# Patient Record
Sex: Male | Born: 1975 | Hispanic: No | Marital: Single | Smoking: Current some day smoker
Health system: Southern US, Community
[De-identification: ages and names within clinical notes are randomized; demographics above are authoritative.]

## PROBLEM LIST (undated history)

## (undated) DIAGNOSIS — F1910 Other psychoactive substance abuse, uncomplicated: Secondary | ICD-10-CM

## (undated) DIAGNOSIS — F419 Anxiety disorder, unspecified: Secondary | ICD-10-CM

## (undated) DIAGNOSIS — B019 Varicella without complication: Secondary | ICD-10-CM

## (undated) HISTORY — PX: WRIST SURGERY: SHX841

## (undated) HISTORY — DX: Varicella without complication: B01.9

## (undated) HISTORY — DX: Other psychoactive substance abuse, uncomplicated: F19.10

---

## 2002-06-30 ENCOUNTER — Emergency Department (HOSPITAL_COMMUNITY): Admission: EM | Admit: 2002-06-30 | Discharge: 2002-07-01 | Payer: Self-pay | Admitting: *Deleted

## 2002-06-30 ENCOUNTER — Encounter: Payer: Self-pay | Admitting: Emergency Medicine

## 2009-11-02 ENCOUNTER — Ambulatory Visit (HOSPITAL_BASED_OUTPATIENT_CLINIC_OR_DEPARTMENT_OTHER): Admission: RE | Admit: 2009-11-02 | Discharge: 2009-11-02 | Payer: Self-pay | Admitting: Orthopedic Surgery

## 2010-09-19 LAB — POCT HEMOGLOBIN-HEMACUE: Hemoglobin: 15.7 g/dL (ref 13.0–17.0)

## 2013-02-23 ENCOUNTER — Ambulatory Visit (INDEPENDENT_AMBULATORY_CARE_PROVIDER_SITE_OTHER): Payer: BC Managed Care – PPO | Admitting: Emergency Medicine

## 2013-02-23 VITALS — BP 142/96 | HR 106 | Temp 98.2°F | Resp 18 | Wt 153.0 lb

## 2013-02-23 DIAGNOSIS — B084 Enteroviral vesicular stomatitis with exanthem: Secondary | ICD-10-CM

## 2013-02-23 MED ORDER — TRAMADOL HCL 50 MG PO TABS: 50.0000 mg | ORAL_TABLET | Freq: Three times a day (TID) | ORAL | Status: DC | PRN

## 2013-02-23 NOTE — Progress Notes (Signed)
[facility] [address] [place] [address] [id number] [phone]  Date:  02/23/2013   Name:  Ronald Benton [facility]   DOB:  1975/12/04   MRN:  [id number]  PCP:  No primary provider on file.    Chief Complaint: Rash   History of Present Illness:  TRAQUAN DUARTE is a 37 y.o. very pleasant male patient who presents with the following:  Ill since Thursday with rash on palms and soles and inside mouth.  Seen by [facility] Friday and was given a cortisone shot.  Has no fever or chills, nausea or vomiting, cough or coryza.  No sick contacts.  No improvement with over the counter medications or other home remedies. Denies other complaint or health concern today.   There are no active problems to display for this patient.   Past Medical History  Diagnosis Date  . Substance abuse     History reviewed. No pertinent past surgical history.  History  Substance Use Topics  . Smoking status: Current Every Day Smoker    Types: Cigarettes  . Smokeless tobacco: Not on file  . Alcohol Use: Yes    History reviewed. No pertinent family history.  No Known Allergies  Medication list has been reviewed and updated.  No current outpatient prescriptions on file prior to visit.   No current facility-administered medications on file prior to visit.    Review of Systems:  As per HPI, otherwise negative.    Physical Examination: Filed Vitals:   02/23/13 1616  BP: 142/96  Pulse: 106  Temp: 98.2 F (36.8 C)  Resp: 18   Filed Vitals:   02/23/13 1616  Weight: 153 lb (69.4 kg)   There is no height on file to calculate BMI. Ideal Body Weight:    GEN: WDWN, NAD, Non-toxic, A & O x 3  Smells of alcohol HEENT: Atraumatic, Normocephalic. Neck supple. No masses, No LAD.  Oropharynx erythematous Ears and Nose: No external deformity. CV: RRR, No M/G/R. No JVD. No thrill. No extra heart sounds. PULM: CTA B, no wheezes, crackles, rhonchi. No retractions. No resp. distress. No accessory  muscle use. ABD: S, NT, ND, +BS. No rebound. No HSM. EXTR: No c/c/e NEURO Normal gait.  PSYCH: Normally interactive. Conversant. Not depressed or anxious appearing.  Calm demeanor.  SKIN:  Rash on palms and soles of feet characteristic of hand foot and mouth disease  Assessment and Plan: Hand foot and mouth disease Tramadol   Signed,  Phillips Odor, MD

## 2013-02-23 NOTE — Patient Instructions (Signed)
Hand, Foot, and Mouth Disease  Hand, foot, and mouth disease is a common viral illness. It occurs mainly in children younger than 37 years of age, but adolescents and adults may also get it. This disease is different than foot and mouth disease that cattle, sheep, and pigs get. Most people are better in 1 week.  CAUSES   Hand, foot, and mouth disease is usually caused by a group of viruses called enteroviruses. Hand, foot, and mouth disease can spread from person to person (contagious). A person is most contagious during the first week of the illness. It is not transmitted to or from pets or other animals. It is most common in the summer and early fall. Infection is spread from person to person by direct contact with an infected person's:  · Nose discharge.  · Throat discharge.  · Stool.  SYMPTOMS   Open sores (ulcers) occur in the mouth. Symptoms may also include:  · A rash on the hands and feet, and occasionally the buttocks.  · Fever.  · Aches.  · Pain from the mouth ulcers.  · Fussiness.  DIAGNOSIS   Hand, foot, and mouth disease is one of many infections that cause mouth sores. To be certain your child has hand, foot, and mouth disease your caregiver will diagnose your child by physical exam. Additional tests are not usually needed.  TREATMENT   Nearly all patients recover without medical treatment in 7 to 10 days. There are no common complications. Your child should only take over-the-counter or prescription medicines for pain, discomfort, or fever as directed by your caregiver. Your caregiver may recommend the use of an over-the-counter antacid or a combination of an antacid and diphenhydramine to help coat the lesions in the mouth and improve symptoms.   HOME CARE INSTRUCTIONS  · Try combinations of foods to see what your child will tolerate and aim for a balanced diet. Soft foods may be easier to swallow. The mouth sores from hand, foot, and mouth disease typically hurt and are painful when exposed to  salty, spicy, or acidic food or drinks.  · Milk and cold drinks are soothing for some patients. Milk shakes, frozen ice pops, slushies, and sherberts are usually well tolerated.  · Sport drinks are good choices for hydration, and they also provide a few calories. Often, a child with hand, foot, and mouth disease will be able to drink without discomfort.    · For younger children and infants, feeding with a cup, spoon, or syringe may be less painful than drinking through the nipple of a bottle.  · Keep children out of childcare programs, schools, or other group settings during the first few days of the illness or until they are without fever. The sores on the body are not contagious.  SEEK IMMEDIATE MEDICAL CARE IF:  · Your child develops signs of dehydration such as:  · Decreased urination.  · Dry mouth, tongue, or lips.  · Decreased tears or sunken eyes.  · Dry skin.  · Rapid breathing.  · Fussy behavior.  · Poor color or pale skin.  · Fingertips taking longer than 2 seconds to turn pink after a gentle squeeze.  · Rapid weight loss.  · Your child does not have adequate pain relief.  · Your child develops a severe headache, stiff neck, or change in behavior.  · Your child develops ulcers or blisters that occur on the lips or outside of the mouth.  Document Released: 03/17/2003 Document Revised: 09/10/2011 Document Reviewed: 11/30/2010    ExitCare® Patient Information ©2014 [employer].

## 2014-10-01 ENCOUNTER — Ambulatory Visit (INDEPENDENT_AMBULATORY_CARE_PROVIDER_SITE_OTHER): Payer: BLUE CROSS/BLUE SHIELD | Admitting: Family Medicine

## 2014-10-01 VITALS — BP 110/70 | HR 83 | Temp 98.1°F | Resp 16 | Ht 70.0 in | Wt 163.5 lb

## 2014-10-01 DIAGNOSIS — H6123 Impacted cerumen, bilateral: Secondary | ICD-10-CM

## 2014-10-01 NOTE — Progress Notes (Signed)
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 He's had 2 weeks of progressive hearing loss in the right ear with a history of cerumen impaction the past.  He's had no vertigo or significant otalgia  Objective: Patient has bilateral cerumen impaction which was lavaged revealing a normal eardrum.  Signed, Sheila OatsKurt Saburo Luger M.D.

## 2015-05-18 ENCOUNTER — Encounter: Payer: Self-pay | Admitting: Family

## 2015-05-18 ENCOUNTER — Ambulatory Visit (INDEPENDENT_AMBULATORY_CARE_PROVIDER_SITE_OTHER): Payer: BLUE CROSS/BLUE SHIELD | Admitting: Family

## 2015-05-18 VITALS — BP 118/80 | HR 95 | Temp 98.7°F | Resp 18 | Ht 70.0 in | Wt 165.0 lb

## 2015-05-18 DIAGNOSIS — Z0000 Encounter for general adult medical examination without abnormal findings: Secondary | ICD-10-CM | POA: Insufficient documentation

## 2015-05-18 DIAGNOSIS — N509 Disorder of male genital organs, unspecified: Secondary | ICD-10-CM | POA: Insufficient documentation

## 2015-05-18 NOTE — Assessment & Plan Note (Signed)
1) Anticipatory Guidance: Discussed importance of wearing a seatbelt while driving and not texting while driving; changing batteries in smoke detector at least once annually; wearing suntan lotion when outside; eating a balanced and moderate diet; getting physical activity at least 30 minutes per day.  2) Immunizations / Screenings / Labs:  Declines tetanus and influenza. All other immunizations are up-to-date per recommendations. Declines HIV. Due for a dental screening which will be scheduled independently. All other screenings are up-to-date per recommendations. Obtain CBC, BMET, Lipid profile and TSH.   Overall adequate exam with risk factors for cardiovascular disease including tobacco use. Reviewed risk factors associated with continued tobacco use and increased risk for cardiovascular as well as respiratory chronic diseases. Not currently ready to quit smoking at this time. He is of good weight. Emphasize importance of a moderate and varied diet to include enough calories to maintain weight. Follow-up prevention exam in one year. Follow-up office visit pending blood work.

## 2015-05-18 NOTE — Progress Notes (Signed)
Subjective:    [facility]    DOB: 08/13/1975, 39 y.o.   MRN: [id number]  Chief Complaint  Patient presents with  . Establish Care    Issues that he would like to discuss with provider    HPI:  Ronald Benton is a 39 y.o. male who presents today for an annual wellness visit.   1) Health Maintenance -   Diet - Averages about 2 meals per day; consisting of a moderate and varied diet.   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    2) Preventative Exams / Immunizations:  Dental -- Due for exam  Vision -- Up to date   Health Maintenance  Topic Date Due  . HIV Screening  03/18/1991  . TETANUS/TDAP  03/18/1995  . INFLUENZA VACCINE  03/16/2016 (Originally 01/31/2015)   There is no immunization history on file for this patient.   3.) Testicular pain - Associated symptom of pain located in his right testicle has been going on for about 3 weeks. Pain is described as achy/dull with the right testicle may be a slight bit larger than the right. Severity of the pain is about 2-6/10. Denies any modifying factors that make it better or worse. Does describe a similar feeling about 10 years ago that was evaluated and went away without any treatment. Does not regularly perform testicular self-exams.   No Known Allergies   No outpatient prescriptions prior to visit.   No facility-administered medications prior to visit.     Past Medical History  Diagnosis Date  . Chicken pox   . Substance abuse      Past Surgical History  Procedure Laterality Date  . Wrist surgery       Family History  Problem Relation Age of Onset  . Family history unknown: Yes     Social History   Social History  . Marital Status: Single    Spouse Name: N/A  . Number of Children: 0  . Years of Education: 12   Occupational History  . Server    Social History Main Topics  . Smoking status: Current Some  Day Smoker -- 20 years    Types: Cigarettes  . Smokeless tobacco: Never Used  . Alcohol Use: 12.6 oz/week    0 Standard drinks or equivalent, 21 Cans of beer per week  . Drug Use: No  . Sexual Activity: Not on file   Other Topics Concern  . Not on file   Social History Narrative   Denies religious beliefs effecting health care.      Review of Systems  Constitutional: Denies fever, chills, fatigue, or significant weight gain/loss. HENT: Head: Denies headache or neck pain Ears: Denies changes in hearing, ringing in ears, earache, drainage Nose: Denies discharge, stuffiness, itching, nosebleed, sinus pain Throat: Denies sore throat, hoarseness, dry mouth, sores, thrush Eyes: Denies loss/changes in vision, pain, redness, blurry/double vision, flashing lights Cardiovascular: Denies chest pain/discomfort, tightness, palpitations, shortness of breath with activity, difficulty lying down, swelling, sudden awakening with shortness of breath Respiratory: Denies shortness of breath, cough, sputum production, wheezing Gastrointestinal: Denies dysphasia, heartburn, change in appetite, nausea, change in bowel habits, rectal bleeding, constipation, diarrhea, yellow skin or eyes Genitourinary: Denies frequency, urgency, burning/pain, blood in urine, incontinence, change in urinary strength. Positive for testicular pain Musculoskeletal: Denies muscle/joint pain, stiffness, back pain, redness or swelling of joints, trauma Skin: Denies rashes, lumps,  itching, dryness, color changes, or hair/nail changes Neurological: Denies dizziness, fainting, seizures, weakness, numbness, tingling, tremor Psychiatric - Denies nervousness, stress, depression or memory loss Endocrine: Denies heat or cold intolerance, sweating, frequent urination, excessive thirst, changes in appetite Hematologic: Denies ease of bruising or bleeding     Objective:     BP 118/80 mmHg  Pulse 95  Temp(Src) 98.7 F (37.1 C) (Oral)   Resp 18  Ht  (1.778 m)  Wt 165 lb (74.844 kg)  BMI 23.68 kg/m2  SpO2 95% Nursing note and vital signs reviewed.  Physical Exam  Constitutional: He is oriented to person, place, and time. He appears well-developed and well-nourished.  HENT:  Head: Normocephalic.  Right Ear: Hearing, tympanic membrane, external ear and ear canal normal.  Left Ear: Hearing, tympanic membrane, external ear and ear canal normal.  Nose: Nose normal.  Mouth/Throat: Uvula is midline, oropharynx is clear and moist and mucous membranes are normal.  Eyes: Conjunctivae and EOM are normal. Pupils are equal, round, and reactive to light.  Neck: Neck supple. No JVD present. No tracheal deviation present. No thyromegaly present.  Cardiovascular: Normal rate, regular rhythm, normal heart sounds and intact distal pulses.   Pulmonary/Chest: Effort normal and breath sounds normal.  Abdominal: Soft. Bowel sounds are normal. He exhibits no distension and no mass. There is no tenderness. There is no rebound and no guarding.  Genitourinary:  Right scrotum appears slightly enlarged, with no warmth, obvious edema, or masses. There is mild tenderness with no identifiable masses.   Musculoskeletal: Normal range of motion. He exhibits no edema or tenderness.  Lymphadenopathy:    He has no cervical adenopathy.  Neurological: He is alert and oriented to person, place, and time. He has normal reflexes. No cranial nerve deficit. He exhibits normal muscle tone. Coordination normal.  Skin: Skin is warm and dry.  Psychiatric: His behavior is normal. Judgment and thought content normal. His mood appears anxious.       Assessment & Plan:   Problem List Items Addressed This Visit      Other   Routine general medical examination at a health care facility - Primary    1) Anticipatory Guidance: Discussed importance of wearing a seatbelt while driving and not texting while driving; changing batteries in smoke detector at least  once annually; wearing suntan lotion when outside; eating a balanced and moderate diet; getting physical activity at least 30 minutes per day.  2) Immunizations / Screenings / Labs:  Declines tetanus and influenza. All other immunizations are up-to-date per recommendations. Declines HIV. Due for a dental screening which will be scheduled independently. All other screenings are up-to-date per recommendations. Obtain CBC, BMET, Lipid profile and TSH.   Overall adequate exam with risk factors for cardiovascular disease including tobacco use. Reviewed risk factors associated with continued tobacco use and increased risk for cardiovascular as well as respiratory chronic diseases. Not currently ready to quit smoking at this time. He is of good weight. Emphasize importance of a moderate and varied diet to include enough calories to maintain weight. Follow-up prevention exam in one year. Follow-up office visit pending blood work.       Relevant Orders   Comprehensive metabolic panel   CBC   Lipid panel   TSH   Tenderness of scrotum    Scrotal tenderness of no direct identifiable origin. Obtain a scrotal ultrasound. No evidence of torsion noted. Follow-up if symptoms worsen or fail to improve prior to ultrasound.  Relevant Orders   US Scrotum

## 2015-05-18 NOTE — Assessment & Plan Note (Signed)
Scrotal tenderness of no direct identifiable origin. Obtain a scrotal ultrasound. No evidence of torsion noted. Follow-up if symptoms worsen or fail to improve prior to ultrasound.

## 2015-05-18 NOTE — Patient Instructions (Addendum)
Thank you for choosing [facility].  Summary/Instructions:  Health Maintenance, Male A healthy lifestyle and preventative care can promote health and wellness.  Maintain regular health, dental, and eye exams.  Eat a healthy diet. Foods like vegetables, fruits, whole grains, low-fat dairy products, and lean protein foods contain the nutrients you need and are low in calories. Decrease your intake of foods high in solid fats, added sugars, and salt. Get information about a proper diet from your health care provider, if necessary.  Regular physical exercise is one of the most important things you can do for your health. Most adults should get at least 150 minutes of moderate-intensity exercise (any activity that increases your heart rate and causes you to sweat) each week. In addition, most adults need muscle-strengthening exercises on 2 or more days a week.   Maintain a healthy weight. The body mass index (BMI) is a screening tool to identify possible weight problems. It provides an estimate of body fat based on height and weight. Your health care provider can find your BMI and can help you achieve or maintain a healthy weight. For males 20 years and older:  A BMI below 18.5 is considered underweight.  A BMI of 18.5 to 24.9 is normal.  A BMI of 25 to 29.9 is considered overweight.  A BMI of 30 and above is considered obese.  Maintain normal blood lipids and cholesterol by exercising and minimizing your intake of saturated fat. Eat a balanced diet with plenty of fruits and vegetables. Blood tests for lipids and cholesterol should begin at age 20 and be repeated every 5 years. If your lipid or cholesterol levels are high, you are over age 50, or you are at high risk for heart disease, you may need your cholesterol levels checked more frequently.Ongoing high lipid and cholesterol levels should be treated with medicines if diet and exercise are not working.  If you smoke, find out from  your health care provider how to quit. If you do not use tobacco, do not start.  Lung cancer screening is recommended for adults aged 55-80 years who are at high risk for developing lung cancer because of a history of smoking. A yearly low-dose CT scan of the lungs is recommended for people who have at least a 30-pack-year history of smoking and are current smokers or have quit within the past 15 years. A pack year of smoking is smoking an average of 1 pack of cigarettes a day for 1 year (for example, a 30-pack-year history of smoking could mean smoking 1 pack a day for 30 years or 2 packs a day for 15 years). Yearly screening should continue until the smoker has stopped smoking for at least 15 years. Yearly screening should be stopped for people who develop a health problem that would prevent them from having lung cancer treatment.  If you choose to drink alcohol, do not have more than 2 drinks per day. One drink is considered to be 12 oz (360 mL) of beer, 5 oz (150 mL) of wine, or 1.5 oz (45 mL) of liquor.  Avoid the use of street drugs. Do not share needles with anyone. Ask for help if you need support or instructions about stopping the use of drugs.  High blood pressure causes heart disease and increases the risk of stroke. High blood pressure is more likely to develop in:  People who have blood pressure in the end of the normal range (100-139/85-89 mm Hg).  People who are overweight   or obese.  People who are African American.  If you are 18-39 years of age, have your blood pressure checked every 3-5 years. If you are 40 years of age or older, have your blood pressure checked every year. You should have your blood pressure measured twice--once when you are at a hospital or clinic, and once when you are not at a hospital or clinic. Record the average of the two measurements. To check your blood pressure when you are not at a hospital or clinic, you can use:  An automated blood pressure machine  at a pharmacy.  A home blood pressure monitor.  If you are 45-79 years old, ask your health care provider if you should take aspirin to prevent heart disease.  Diabetes screening involves taking a blood sample to check your fasting blood sugar level. This should be done once every 3 years after age 45 if you are at a normal weight and without risk factors for diabetes. Testing should be considered at a younger age or be carried out more frequently if you are overweight and have at least 1 risk factor for diabetes.  Colorectal cancer can be detected and often prevented. Most routine colorectal cancer screening begins at the age of 50 and continues through age 75. However, your health care provider may recommend screening at an earlier age if you have risk factors for colon cancer. On a yearly basis, your health care provider may provide home test kits to check for hidden blood in the stool. A small camera at the end of a tube may be used to directly examine the colon (sigmoidoscopy or colonoscopy) to detect the earliest forms of colorectal cancer. Talk to your health care provider about this at age 50 when routine screening begins. A direct exam of the colon should be repeated every 5-10 years through age 75, unless early forms of precancerous polyps or small growths are found.  People who are at an increased risk for hepatitis B should be screened for this virus. You are considered at high risk for hepatitis B if:  You were born in a country where hepatitis B occurs often. Talk with your health care provider about which countries are considered high risk.  Your parents were born in a high-risk country and you have not received a shot to protect against hepatitis B (hepatitis B vaccine).  You have HIV or AIDS.  You use needles to inject street drugs.  You live with, or have sex with, someone who has hepatitis B.  You are a man who has sex with other men (MSM).  You get hemodialysis  treatment.  You take certain medicines for conditions like cancer, organ transplantation, and autoimmune conditions.  Hepatitis C blood testing is recommended for all people born from 1945 through 1965 and any individual with known risk factors for hepatitis C.  Healthy men should no longer receive prostate-specific antigen (PSA) blood tests as part of routine cancer screening. Talk to your health care provider about prostate cancer screening.  Testicular cancer screening is not recommended for adolescents or adult males who have no symptoms. Screening includes self-exam, a health care provider exam, and other screening tests. Consult with your health care provider about any symptoms you have or any concerns you have about testicular cancer.  Practice safe sex. Use condoms and avoid high-risk sexual practices to reduce the spread of sexually transmitted infections (STIs).  You should be screened for STIs, including gonorrhea and chlamydia if:  You are sexually   active and are younger than 24 years.  You are older than 24 years, and your health care provider tells you that you are at risk for this type of infection.  Your sexual activity has changed since you were last screened, and you are at an increased risk for chlamydia or gonorrhea. Ask your health care provider if you are at risk.  If you are at risk of being infected with HIV, it is recommended that you take a prescription medicine daily to prevent HIV infection. This is called pre-exposure prophylaxis (PrEP). You are considered at risk if:  You are a man who has sex with other men (MSM).  You are a heterosexual man who is sexually active with multiple partners.  You take drugs by injection.  You are sexually active with a partner who has HIV.  Talk with your health care provider about whether you are at high risk of being infected with HIV. If you choose to begin PrEP, you should first be tested for HIV. You should then be tested  every 3 months for as long as you are taking PrEP.  Use sunscreen. Apply sunscreen liberally and repeatedly throughout the day. You should seek shade when your shadow is shorter than you. Protect yourself by wearing long sleeves, pants, a wide-brimmed hat, and sunglasses year round whenever you are outdoors.  Tell your health care provider of new moles or changes in moles, especially if there is a change in shape or color. Also, tell your health care provider if a mole is larger than the size of a pencil eraser.  A one-time screening for abdominal aortic aneurysm (AAA) and surgical repair of large AAAs by ultrasound is recommended for men aged 65-75 years who are current or former smokers.  Stay current with your vaccines (immunizations).   This information is not intended to replace advice given to you by your health care provider. Make sure you discuss any questions you have with your health care provider.   Document Released: 12/15/2007 Document Revised: 07/09/2014 Document Reviewed: 11/13/2010 Elsevier Interactive Patient Education 2016 [employer].  

## 2015-05-18 NOTE — Progress Notes (Signed)
Pre visit review using our clinic review tool, if applicable. No additional management support is needed unless otherwise documented below in the visit note. 

## 2015-06-03 ENCOUNTER — Ambulatory Visit (HOSPITAL_COMMUNITY)
Admission: RE | Admit: 2015-06-03 | Discharge: 2015-06-03 | Disposition: A | Discharge status: Home / Self Care | Payer: BLUE CROSS/BLUE SHIELD | Source: Ambulatory Visit | Attending: Family | Admitting: Family

## 2015-06-03 ENCOUNTER — Telehealth: Payer: Self-pay | Admitting: Family

## 2015-06-03 DIAGNOSIS — N50811 Right testicular pain: Secondary | ICD-10-CM | POA: Diagnosis present

## 2015-06-03 DIAGNOSIS — N509 Disorder of male genital organs, unspecified: Secondary | ICD-10-CM | POA: Insufficient documentation

## 2015-06-03 DIAGNOSIS — N433 Hydrocele, unspecified: Secondary | ICD-10-CM | POA: Insufficient documentation

## 2015-06-03 MED ORDER — CIPROFLOXACIN HCL 250 MG PO TABS: 250.0000 mg | ORAL_TABLET | Freq: Two times a day (BID) | ORAL | Status: DC

## 2015-06-03 NOTE — Telephone Encounter (Signed)
Pt called request ultrasound result that was done today. Pt is very concern and want to know today. Please give him a call back ASAP  Phone [phone]

## 2015-06-03 NOTE — Telephone Encounter (Signed)
Discussed normal results with patient. He continues to be concerned that something is wrong. Will attempt treatment with Cipro and refer to urology for further management.

## 2015-06-03 NOTE — Telephone Encounter (Signed)
Spoke with patient regarding this and plan of care.

## 2015-06-06 ENCOUNTER — Ambulatory Visit (HOSPITAL_COMMUNITY): Payer: BLUE CROSS/BLUE SHIELD

## 2015-06-14 ENCOUNTER — Encounter: Payer: Self-pay | Admitting: Family

## 2015-10-13 ENCOUNTER — Ambulatory Visit (INDEPENDENT_AMBULATORY_CARE_PROVIDER_SITE_OTHER): Payer: BLUE CROSS/BLUE SHIELD

## 2015-10-13 ENCOUNTER — Ambulatory Visit (INDEPENDENT_AMBULATORY_CARE_PROVIDER_SITE_OTHER): Payer: BLUE CROSS/BLUE SHIELD | Admitting: Family Medicine

## 2015-10-13 VITALS — BP 118/82 | HR 90 | Temp 98.3°F | Resp 18 | Ht 70.0 in | Wt 168.4 lb

## 2015-10-13 DIAGNOSIS — R0789 Other chest pain: Secondary | ICD-10-CM

## 2015-10-13 DIAGNOSIS — M25562 Pain in left knee: Secondary | ICD-10-CM | POA: Diagnosis not present

## 2015-10-13 DIAGNOSIS — M25462 Effusion, left knee: Secondary | ICD-10-CM

## 2015-10-13 DIAGNOSIS — M79672 Pain in left foot: Secondary | ICD-10-CM

## 2015-10-13 DIAGNOSIS — M79675 Pain in left toe(s): Secondary | ICD-10-CM

## 2015-10-13 DIAGNOSIS — S92912A Unspecified fracture of left toe(s), initial encounter for closed fracture: Secondary | ICD-10-CM | POA: Diagnosis not present

## 2015-10-13 MED ORDER — TRAMADOL HCL 50 MG PO TABS: 50.0000 mg | ORAL_TABLET | Freq: Four times a day (QID) | ORAL | Status: DC | PRN
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
## 2015-10-13 NOTE — Patient Instructions (Addendum)
IF you received an x-ray today, you will receive an invoice from [facility]. Please contact [facility] at [phone] with questions or concerns regarding your invoice.   IF you received labwork today, you will receive an invoice from [employer]. Please contact [facility] at [phone] with questions or concerns regarding your invoice.   Our billing staff will not be able to assist you with questions regarding bills from these companies.  You will be contacted with the lab results as soon as they are available. The fastest way to get your results is to activate your My Chart account. Instructions are located on the last page of this paperwork. If you have not heard from [place] regarding the results in 2 weeks, please contact this office.     Your chest wall pain may be either a rib contusion or pulled muscle in the chest wall. See information this below. Rib fracture is possible, so if you have increasing or worsening chest pain, shortness of breath, cough, or otherwise worsening, recommend recheck here or elsewhere for possible x-rays.   Wear the hinged knee brace on your left knee for the next 1-2 weeks, but if not improving, recommend he be seen by orthopedist.  For toe fracture, wear hard soled shoes such as a hiking boot or stiff boot with buddy taping of the second to third toes for the next 4 weeks. Recheck in 2 weeks.   Tramadol if needed for pain, or alleve if milder pain (stop alleve if any rash or new reactions).   Return to the clinic or go to the nearest emergency room if any of your symptoms worsen or new symptoms occur.    Knee Pain Knee pain is a very common symptom and can have many causes. Knee pain often goes away when you follow your health care provider's instructions for relieving pain and discomfort at home. However, knee pain can develop into a condition that needs treatment. Some conditions may include:  Arthritis  caused by wear and tear (osteoarthritis).  Arthritis caused by swelling and irritation (rheumatoid arthritis or gout).  A cyst or growth in your knee.  An infection in your knee joint.  An injury that will not heal.  Damage, swelling, or irritation of the tissues that support your knee (torn ligaments or tendinitis). If your knee pain continues, additional tests may be ordered to diagnose your condition. Tests may include X-rays or other imaging studies of your knee. You may also need to have fluid removed from your knee. Treatment for ongoing knee pain depends on the cause, but treatment may include:  Medicines to relieve pain or swelling.  Steroid injections in your knee.  Physical therapy.  Surgery. HOME CARE INSTRUCTIONS  Take medicines only as directed by your health care provider.  Rest your knee and keep it raised (elevated) while you are resting.  Do not do things that cause or worsen pain.  Avoid high-impact activities or exercises, such as running, jumping rope, or doing jumping jacks.  Apply ice to the knee area:  Put ice in a plastic bag.  Place a towel between your skin and the bag.  Leave the ice on for 20 minutes, 2-3 times a day.  Ask your health care provider if you should wear an elastic knee support.  Keep a pillow under your knee when you sleep.  Lose weight if you are overweight. Extra weight can put pressure on your knee.  Do not use any tobacco products, including  cigarettes, chewing tobacco, or electronic cigarettes. If you need help quitting, ask your health care provider. Smoking may slow the healing of any bone and joint problems that you may have. SEEK MEDICAL CARE IF:  Your knee pain continues, changes, or gets worse.  You have a fever along with knee pain.  Your knee buckles or locks up.  Your knee becomes more swollen. SEEK IMMEDIATE MEDICAL CARE IF:   Your knee joint feels hot to the touch.  You have chest pain or trouble  breathing.   This information is not intended to replace advice given to you by your health care provider. Make sure you discuss any questions you have with your health care provider.   Document Released: 04/15/2007 Document Revised: 07/09/2014 Document Reviewed: 02/01/2014 Elsevier Interactive Patient Education 2016 Elsevier Inc.    Chest Wall Pain Chest wall pain is pain in or around the bones and muscles of your chest. Sometimes, an injury causes this pain. Sometimes, the cause may not be known. This pain may take several weeks or longer to get better. HOME CARE INSTRUCTIONS  Pay attention to any changes in your symptoms. Take these actions to help with your pain:   Rest as told by your health care provider.   Avoid activities that cause pain. These include any activities that use your chest muscles or your abdominal and side muscles to lift heavy items.   If directed, apply ice to the painful area:  Put ice in a plastic bag.  Place a towel between your skin and the bag.  Leave the ice on for 20 minutes, 2-3 times per day.  Take over-the-counter and prescription medicines only as told by your health care provider.  Do not use tobacco products, including cigarettes, chewing tobacco, and e-cigarettes. If you need help quitting, ask your health care provider.  Keep all follow-up visits as told by your health care provider. This is important. SEEK MEDICAL CARE IF:  You have a fever.  Your chest pain becomes worse.  You have new symptoms. SEEK IMMEDIATE MEDICAL CARE IF:  You have nausea or vomiting.  You feel sweaty or light-headed.  You have a cough with phlegm (sputum) or you cough up blood.  You develop shortness of breath.   This information is not intended to replace advice given to you by your health care provider. Make sure you discuss any questions you have with your health care provider.   Document Released: 06/18/2005 Document Revised: 03/09/2015 Document  Reviewed: 09/13/2014 Elsevier Interactive Patient Education [employer] Elsevier Inc.

## 2017-09-13 ENCOUNTER — Ambulatory Visit: Payer: Self-pay | Admitting: *Deleted

## 2017-09-13 ENCOUNTER — Encounter: Payer: Self-pay | Admitting: Family

## 2017-09-13 ENCOUNTER — Other Ambulatory Visit (INDEPENDENT_AMBULATORY_CARE_PROVIDER_SITE_OTHER): Payer: 59

## 2017-09-13 ENCOUNTER — Ambulatory Visit: Payer: 59 | Admitting: Family

## 2017-09-13 VITALS — BP 138/74 | HR 107 | Temp 98.8°F | Ht 70.0 in | Wt 162.1 lb

## 2017-09-13 DIAGNOSIS — N50811 Right testicular pain: Secondary | ICD-10-CM | POA: Diagnosis not present

## 2017-09-13 DIAGNOSIS — R361 Hematospermia: Secondary | ICD-10-CM | POA: Diagnosis not present

## 2017-09-13 LAB — PSA: PSA: 1.93 ng/mL (ref 0.10–4.00)

## 2017-09-13 LAB — COMPREHENSIVE METABOLIC PANEL
ALBUMIN: 4.5 g/dL (ref 3.5–5.2)
ALT: 136 U/L — AB (ref 0–53)
AST: 185 U/L — AB (ref 0–37)
Alkaline Phosphatase: 66 U/L (ref 39–117)
BILIRUBIN TOTAL: 0.4 mg/dL (ref 0.2–1.2)
BUN: 9 mg/dL (ref 6–23)
CALCIUM: 9.3 mg/dL (ref 8.4–10.5)
CHLORIDE: 105 meq/L (ref 96–112)
CO2: 28 meq/L (ref 19–32)
CREATININE: 0.85 mg/dL (ref 0.40–1.50)
GFR: 105.32 mL/min (ref >60.00)
Glucose, Bld: 119 mg/dL — ABNORMAL HIGH (ref 70–99)
Potassium: 3.9 mEq/L (ref 3.5–5.1)
SODIUM: 140 meq/L (ref 135–145)
Total Protein: 7.4 g/dL (ref 6.0–8.3)

## 2017-09-13 LAB — URINALYSIS, ROUTINE W REFLEX MICROSCOPIC
Bilirubin Urine: NEGATIVE
KETONES UR: NEGATIVE
Leukocytes, UA: NEGATIVE
Nitrite: NEGATIVE
SPECIFIC GRAVITY, URINE: 1.01 (ref 1.000–1.030)
Total Protein, Urine: NEGATIVE
URINE GLUCOSE: NEGATIVE
UROBILINOGEN UA: 0.2 (ref 0.0–1.0)
pH: 6 (ref 5.0–8.0)

## 2017-09-13 LAB — CBC WITH DIFFERENTIAL/PLATELET
BASOS PCT: 1.4 % (ref 0.0–3.0)
Basophils Absolute: 0.1 10*3/uL (ref 0.0–0.1)
EOS ABS: 0.3 10*3/uL (ref 0.0–0.7)
EOS PCT: 6.8 % — AB (ref 0.0–5.0)
HCT: 45.6 % (ref 39.0–52.0)
HEMOGLOBIN: 16 g/dL (ref 13.0–17.0)
LYMPHS ABS: 1.5 10*3/uL (ref 0.7–4.0)
Lymphocytes Relative: 33.4 % (ref 12.0–46.0)
MCHC: 35 g/dL (ref 30.0–36.0)
MCV: 101.9 fl — ABNORMAL HIGH (ref 78.0–100.0)
MONO ABS: 0.7 10*3/uL (ref 0.1–1.0)
Monocytes Relative: 16.2 % — ABNORMAL HIGH (ref 3.0–12.0)
NEUTROS PCT: 42.2 % — AB (ref 43.0–77.0)
Neutro Abs: 1.9 10*3/uL (ref 1.4–7.7)
Platelets: 205 10*3/uL (ref 150.0–400.0)
RBC: 4.48 Mil/uL (ref 4.22–5.81)
RDW: 11.7 % (ref 11.5–15.5)
WBC: 4.5 10*3/uL (ref 4.0–10.5)

## 2017-09-13 MED ORDER — CIPROFLOXACIN HCL 500 MG PO TABS
500.0000 mg | ORAL_TABLET | Freq: Two times a day (BID) | ORAL | 0 refills | Status: DC
Start: 2017-09-13 — End: 2017-10-01

## 2017-09-13 NOTE — Patient Instructions (Signed)
Prostatitis Prostatitis is swelling of the prostate gland. The prostate helps to make semen. It is below a man's bladder, in front of the rectum. There are different types of prostatitis. Follow these instructions at home:  Take over-the-counter and prescription medicines only as told by your doctor.  If you were prescribed an antibiotic medicine, take it as told by your doctor. Do not stop taking the antibiotic even if you start to feel better.  If your doctor prescribed exercises, do them as directed.  Take sitz baths as told by your doctor. To take a sitz bath, sit in warm water that is deep enough to cover your hips and butt.  Keep all follow-up visits as told by your doctor. This is important. Contact a doctor if:  Your symptoms get worse.  You have a fever. Get help right away if:  You have chills.  You feel sick to your stomach (nauseous).  You throw up (vomit).  You feel light-headed.  You feel like you might pass out (faint).  You cannot pee (urinate).  You have blood or clumps of blood (blood clots) in your pee (urine). This information is not intended to replace advice given to you by your health care provider. Make sure you discuss any questions you have with your health care provider. Document Released: 12/18/2011 Document Revised: 03/08/2016 Document Reviewed: 03/08/2016 Elsevier Interactive Patient Education  2017 Elsevier Inc.  

## 2017-09-13 NOTE — Telephone Encounter (Signed)
When  Called  [facility]  To  Discuss  Plan of  Care  And  Patient  Had  Already  Spoken to  Practice  .  Colon BranchCarson   States  Pt  Has  An  Appointment  With Ria ClockLaura  Murray  At  120 pm . He was  Advised  To  Keep the  Appointment  . And  He  Was  Given  directions  To  The  Office      Answer Assessment - Initial Assessment Questions 1. SCROTAL SWELLING: "What does the scrotum look like?" "How swollen is it?" (mild, moderate severe; compare to other side)     Swollen r  Side  Testicle    -  Compared  To  Moderate    2. LOCATION: "Where is the swelling located?"       R testicle    3. ONSET: "When did the swelling start?"        Swelling  Started  3  Months  Ago   4. PATTERN: "Does it come and go, or has it been constant since it started?"          Constant    5. SCROTAL PAIN: "Is there any pain?" If so, ask: "How bad is it?"  (Scale 1-10; or mild, moderate, severe)           Pt  Reports scale  Of  3    6. HERNIA: "Has a doctor ever told you that you have a hernia?"            No  7. OTHER SYMPTOMS: "Do you have any other symptoms?" (e.g., fever, abdominal pain, vomiting, difficulty passing urine)             Pt  Reports   Small  Amount of  Blood   Mixed   In  With  Semen  During  Ejaculation    yest   First  Time  He  Has  Noticed  It  Protocols used: [facility]

## 2017-09-13 NOTE — Progress Notes (Signed)
Ronald Benton is a 42 y.o. male with the following history as recorded in Epic[facility]:  Patient Active Problem List   Diagnosis Date Noted  . Routine general medical examination at a health care facility 05/18/2015  . Tenderness of scrotum 05/18/2015    Current Outpatient Medications  Medication Sig Dispense Refill  . ALPRAZolam (XANAX) 1 MG tablet Take 1 mg by mouth 3 (three) times daily.    . ciprofloxacin (CIPRO) 500 MG tablet Take 1 tablet (500 mg total) by mouth 2 (two) times daily. 20 tablet 0   No current facility-administered medications for this visit.     Allergies: Ibuprofen  Past Medical History:  Diagnosis Date  . Chicken pox   . Substance abuse [facility])     Past Surgical History:  Procedure Laterality Date  . WRIST SURGERY      Family History  Family history unknown: Yes    Social History   Tobacco Use  . Smoking status: Current Some Day Smoker    Years: 20.00    Types: Cigarettes  . Smokeless tobacco: Never Used  Substance Use Topics  . Alcohol use: Yes    Alcohol/week: 12.6 oz    Types: 21 Cans of beer per week    Comment: Patient notes he will drink 6 drinks/ night on average    Subjective:  3 month history of right sided testicular pain; similar symptoms in 2016- normal ultrasound; did see blood in semen yesterday; no blood in urine or pain with urination; no concerns for STD exposure; no known fevers; has not been sexually active in the past 6 months; similar symptoms in 2016- normal ultrasound except for small left sided hydrocele; saw urology at that time and no further follow-up needed;  Patient has marked anxiety in the office today and admits he is very scared about his symptoms and the potential for testicular cancer; notes that seeing blood in his semen yesterday was the change that prompted him to call for appointment today; has not experienced a change in pain level- averaging 2-3/10 on a pain scale.    Objective:  Vitals:   09/13/17 1312  BP:  138/74  Pulse: (!) 107  Temp: 98.8 F (37.1 C)  TempSrc: Oral  SpO2: 97%  Weight: 162 lb 1.9 oz (73.5 kg)  Height: '5\' 10"'  (1.778 m)    General: Well developed, well nourished, in no acute distress  Skin : Warm and dry.  Head: Normocephalic and atraumatic  Lungs: Respirations unlabored; clear to auscultation bilaterally without wheeze, rales, rhonchi  Neurologic: Alert and oriented; speech intact; face symmetrical; moves all extremities well; CNII-XII intact without focal deficit  Testicular exam- mild swelling noted in right testicle but no mass noted; right testicle is larger than left; no inguinal hernia noted  Assessment:  1. Hematospermia   2. Testicular pain, right     Plan:  ? Prostatitis; patient defers ultrasound at this time- notes he would only like to do imaging if "absolutely necessary." Start Cipro 500 mg bid x 10 days- he is encouraged to limit his alcohol use while on antibiotics and in general; check CBC, CMP, PSA, U/a and urine culture today; follow-up to be determined.   No Follow-up on file.  Orders Placed This Encounter  Procedures  . Urine Culture    Standing Status:   Future    Number of Occurrences:   1    Standing Expiration Date:   09/13/2018  . CBC w/Diff    Standing Status:  Future    Number of Occurrences:   1    Standing Expiration Date:   09/13/2018  . PSA    Standing Status:   Future    Number of Occurrences:   1    Standing Expiration Date:   09/13/2018  . Urinalysis    Standing Status:   Future    Number of Occurrences:   1    Standing Expiration Date:   09/13/2018  . Comp Met (CMET)    Standing Status:   Future    Number of Occurrences:   1    Standing Expiration Date:   09/13/2018    Requested Prescriptions   Signed Prescriptions Disp Refills  . ciprofloxacin (CIPRO) 500 MG tablet 20 tablet 0    Sig: Take 1 tablet (500 mg total) by mouth 2 (two) times daily.

## 2017-09-15 LAB — URINE CULTURE
MICRO NUMBER:: 90331456
Result:: NO GROWTH
SPECIMEN QUALITY:: ADEQUATE

## 2017-09-16 ENCOUNTER — Other Ambulatory Visit: Payer: Self-pay | Admitting: Family

## 2017-09-17 ENCOUNTER — Telehealth: Payer: Self-pay

## 2017-09-17 ENCOUNTER — Other Ambulatory Visit: Payer: Self-pay | Admitting: Family

## 2017-09-17 DIAGNOSIS — N5089 Other specified disorders of the male genital organs: Secondary | ICD-10-CM

## 2017-09-17 DIAGNOSIS — R945 Abnormal results of liver function studies: Principal | ICD-10-CM

## 2017-09-17 DIAGNOSIS — R3129 Other microscopic hematuria: Secondary | ICD-10-CM

## 2017-09-17 DIAGNOSIS — R7989 Other specified abnormal findings of blood chemistry: Secondary | ICD-10-CM

## 2017-09-17 DIAGNOSIS — R361 Hematospermia: Secondary | ICD-10-CM

## 2017-09-17 NOTE — Telephone Encounter (Signed)
(  Routing to Vernona RiegerLaura as an Financial plannerYI) Patient returned call. Call center informed him of the Urology referral and order for US of his liver. Cipro is helping slightly.

## 2017-10-01 ENCOUNTER — Ambulatory Visit: Payer: 59 | Admitting: Internal Medicine

## 2017-10-01 ENCOUNTER — Encounter: Payer: Self-pay | Admitting: Internal Medicine

## 2017-10-01 VITALS — BP 128/94 | HR 84 | Temp 97.8°F | Resp 16 | Ht 70.0 in | Wt 168.0 lb

## 2017-10-01 DIAGNOSIS — F419 Anxiety disorder, unspecified: Secondary | ICD-10-CM | POA: Insufficient documentation

## 2017-10-01 DIAGNOSIS — F1910 Other psychoactive substance abuse, uncomplicated: Secondary | ICD-10-CM | POA: Diagnosis not present

## 2017-10-01 DIAGNOSIS — F1010 Alcohol abuse, uncomplicated: Secondary | ICD-10-CM

## 2017-10-01 DIAGNOSIS — N509 Disorder of male genital organs, unspecified: Secondary | ICD-10-CM

## 2017-10-01 DIAGNOSIS — F1020 Alcohol dependence, uncomplicated: Secondary | ICD-10-CM | POA: Insufficient documentation

## 2017-10-01 NOTE — Assessment & Plan Note (Signed)
Drinks 6 drinks / night on average Has been in rehab in the past Not ready to decrease his alcohol use at this time Following with psychiatry

## 2017-10-01 NOTE — Progress Notes (Signed)
Subjective:    Patient ID: Ronald BaloMatthew J [facility] Benton, male    DOB: February 05, 1976, 42 y.o.   MRN: [id number]  HPI  He is here to establish with a new pcp.   He is here for follow up.   He was seen here recently for right testicular pain and blood in his semen once.  He had similar symptoms in 2016 and did have an ultrasound at that time which showed a left-sided hydrocele.  He is concerned about the possibility of testicular cancer.  Urinalysis showed small amount of blood, but no obvious infection.  He was treated with Cipro for probable prostatitis.  He was referred to urology, but has not yet seen them.  The cipro has not helped anything.  He still has right scrotal pain and it is intermittent.  The right scrotum is swollen.  He denies any urinary symptoms. He denies fever or chills.    He did have some routine blood work and was noted to have elevated LFTs.  An ultrasound was ordered, but has not been scheduled.  He does drink excessive alcohol and states he can drink 6 beers a night.  Anxiety:  He follows with psychiatry.  He is taking xanax.  He does feel anxious today and today's appt.    Polysubstance abuse:  He is a heavy drinker.  He has been in rehab.  Drinks 5-6 drinks a night.  He is not ready to stop drinking.  He does occasional marijuana and cocaine, but not regularly.    Medications and allergies reviewed with patient and updated if appropriate.  Patient Active Problem List   Diagnosis Date Noted  . Tenderness of scrotum 05/18/2015    Current Outpatient Medications on File Prior to Visit  Medication Sig Dispense Refill  . ALPRAZolam (XANAX) 1 MG tablet Take 1 mg by mouth 3 (three) times daily.     No current facility-administered medications on file prior to visit.     Past Medical History:  Diagnosis Date  . Chicken pox   . Substance abuse [facility](HCC)     Past Surgical History:  Procedure Laterality Date  . WRIST SURGERY      Social History   Socioeconomic History  .  Marital status: Single    Spouse name: Not on file  . Number of children: 0  . Years of education: 6612  . Highest education level: Not on file  Occupational History  . Occupation: Academic librarianerver  Social Needs  . Financial resource strain: Not on file  . Food insecurity:    Worry: Not on file    Inability: Not on file  . Transportation needs:    Medical: Not on file    Non-medical: Not on file  Tobacco Use  . Smoking status: Current Some Day Smoker    Years: 20.00    Types: Cigarettes  . Smokeless tobacco: Never Used  Substance and Sexual Activity  . Alcohol use: Yes    Alcohol/week: 12.6 oz    Types: 21 Cans of beer per week    Comment: Patient notes he will drink 6 drinks/ night on average  . Drug use: No  . Sexual activity: Not on file  Lifestyle  . Physical activity:    Days per week: Not on file    Minutes per session: Not on file  . Stress: Not on file  Relationships  . Social connections:    Talks on phone: Not on file    Gets together: Not on  file    Attends religious service: Not on file    Active member of club or organization: Not on file    Attends meetings of clubs or organizations: Not on file    Relationship status: Not on file  Other Topics Concern  . Not on file  Social History Narrative   Denies religious beliefs effecting health care.     Family History  Family history unknown: Yes    Review of Systems  Constitutional: Negative for chills and fever.  Respiratory: Negative for cough, shortness of breath and wheezing.   Cardiovascular: Positive for palpitations (with anxiety). Negative for chest pain.  Gastrointestinal: Positive for nausea (occasional). Negative for abdominal pain.  Genitourinary: Positive for scrotal swelling (right side) and testicular pain (right side - dull). Negative for difficulty urinating, dysuria and hematuria.  Neurological: Negative for light-headedness and headaches.  Psychiatric/Behavioral: The patient is nervous/anxious.          Objective:   Vitals:   10/01/17 1353  BP: (!) 128/94  Pulse: 84  Resp: 16  Temp: 97.8 F (36.6 C)  SpO2: 98%   Filed Weights   10/01/17 1353  Weight: 168 lb (76.2 kg)   Body mass index is 24.11 kg/m.  BP Readings from Last 3 Encounters:  10/01/17 (!) 128/94  09/13/17 138/74  10/13/15 118/82    Wt Readings from Last 3 Encounters:  10/01/17 168 lb (76.2 kg)  09/13/17 162 lb 1.9 oz (73.5 kg)  10/13/15 168 lb 6.4 oz (76.4 kg)     Physical Exam Constitutional: He appears well-developed and well-nourished. No distress.  HENT:  Head: Normocephalic and atraumatic.  Neck: Neck supple. No tracheal deviation present. No thyromegaly present.  No carotid bruit  Cardiovascular: Normal rate, regular rhythm, normal heart sounds and intact distal pulses. No murmur heard.  No edema Pulmonary/Chest: Effort normal and breath sounds normal. No respiratory distress. He has no wheezes. He has no rales.  Genitourinary: deferred  Skin: Skin is warm and dry. He is not diaphoretic.  Psychiatric: anxious normal mood and affect. His behavior is normal.         Assessment & Plan:     See Problem List for Assessment and Plan of chronic medical problems.

## 2017-10-01 NOTE — Assessment & Plan Note (Signed)
Still with right sided scrotum tenderness and swelling No change with cipro Urology called him yesterday to schedule - he will call them back to set up an appt Deferred US - will wait until he sees urology

## 2017-10-01 NOTE — Assessment & Plan Note (Signed)
Appears anxious today , but he admits to be anxious about today's appt management per Dr Jeraldine LootsKuar

## 2017-10-01 NOTE — Patient Instructions (Addendum)
Follow up with urology.    Follow up as needed.

## 2017-10-02 ENCOUNTER — Other Ambulatory Visit: Payer: Self-pay | Admitting: Family

## 2017-10-02 DIAGNOSIS — R945 Abnormal results of liver function studies: Principal | ICD-10-CM

## 2017-10-02 DIAGNOSIS — R7989 Other specified abnormal findings of blood chemistry: Secondary | ICD-10-CM

## 2017-10-25 ENCOUNTER — Encounter: Payer: Self-pay | Admitting: Family

## 2018-01-01 ENCOUNTER — Encounter: Payer: Self-pay | Admitting: Internal Medicine

## 2018-06-29 ENCOUNTER — Emergency Department (HOSPITAL_COMMUNITY)
Admission: EM | Admit: 2018-06-29 | Discharge: 2018-06-29 | Disposition: A | Discharge status: Home / Self Care | Payer: Managed Care, Other (non HMO) | Attending: Emergency Medicine | Admitting: Emergency Medicine

## 2018-06-29 ENCOUNTER — Encounter (HOSPITAL_COMMUNITY): Payer: Self-pay | Admitting: Emergency Medicine

## 2018-06-29 DIAGNOSIS — K644 Residual hemorrhoidal skin tags: Secondary | ICD-10-CM | POA: Diagnosis not present

## 2018-06-29 DIAGNOSIS — F17210 Nicotine dependence, cigarettes, uncomplicated: Secondary | ICD-10-CM | POA: Diagnosis not present

## 2018-06-29 DIAGNOSIS — K6289 Other specified diseases of anus and rectum: Secondary | ICD-10-CM | POA: Diagnosis present

## 2018-06-29 DIAGNOSIS — R197 Diarrhea, unspecified: Secondary | ICD-10-CM | POA: Insufficient documentation

## 2018-06-29 DIAGNOSIS — R195 Other fecal abnormalities: Secondary | ICD-10-CM | POA: Diagnosis not present

## 2018-06-29 DIAGNOSIS — K5900 Constipation, unspecified: Secondary | ICD-10-CM | POA: Insufficient documentation

## 2018-06-29 DIAGNOSIS — Z79899 Other long term (current) drug therapy: Secondary | ICD-10-CM | POA: Insufficient documentation

## 2018-06-29 MED ORDER — STARCH 51 % RE SUPP
1.0000 | RECTAL | 0 refills | Status: DC | PRN
Start: 2018-06-29 — End: 2019-11-10

## 2018-06-29 MED ORDER — ACETAMINOPHEN 325 MG PO TABS
650.0000 mg | ORAL_TABLET | Freq: Once | ORAL | Status: DC
  Filled 2018-06-29: qty 2

## 2018-06-29 MED ORDER — HYDROCORTISONE 0.5 % EX CREA: 1.0000 "application " | TOPICAL_CREAM | Freq: Two times a day (BID) | CUTANEOUS | 0 refills | Status: DC

## 2018-06-29 MED ORDER — DOCUSATE SODIUM 250 MG PO CAPS: 250.0000 mg | ORAL_CAPSULE | Freq: Every day | ORAL | 0 refills | Status: DC

## 2018-06-29 NOTE — ED Triage Notes (Signed)
Pt reports having rectal pains and blood in stool for past 11 days. Reports works in [employer] and on [employer] a lot.

## 2018-06-29 NOTE — ED Provider Notes (Signed)
[facility]-EMERGENCY DEPT Provider Note   CSN: [id number] Arrival date & time: 06/29/18  1222     History   Chief Complaint Chief Complaint  Patient presents with  . Rectal Pain    HPI Ronald Benton is a 42 y.o. male with a history of substance and alcohol abuse presenting with rectal pain x 11 days. He began having rectal pain and looked to see that he has a hemorrhoid. He describes the pain as sharp and rates it 6 out of 10. The pain is worse with movement. It does not radiate. He has had both constipation and diarrhea since the pain started. He reports intermittent bright red streak of blood in his stool. He has tried OTC hemorrhoids treatment to include Preparation H and another cream that he does not remember the name of. He has transient relief with the creams.  He does not have a history of hemorrhoids. He denies pruritus, fever, night sweats, vomiting, abdominal pain.    Past Medical History:  Diagnosis Date  . Chicken pox   . Substance abuse [facility](HCC)     Patient Active Problem List   Diagnosis Date Noted  . Alcohol abuse 10/01/2017  . Anxiety 10/01/2017  . Polysubstance abuse (HCC) 10/01/2017  . Tenderness of scrotum 05/18/2015    Past Surgical History:  Procedure Laterality Date  . WRIST SURGERY          Home Medications    Prior to Admission medications   Medication Sig Start Date End Date Taking? Authorizing Provider  ALPRAZolam Prudy Feeler(XANAX) 1 MG tablet Take 1 mg by mouth 3 (three) times daily.    [provider]    Family History Family History  Family history unknown: Yes    Social History Social History   Tobacco Use  . Smoking status: Current Some Day Smoker    Years: 20.00    Types: Cigarettes  . Smokeless tobacco: Never Used  Substance Use Topics  . Alcohol use: Yes    Alcohol/week: 21.0 standard drinks    Types: 21 Cans of beer per week    Comment: Patient notes he will drink 6 drinks/ night on average    . Drug use: No     Allergies   Ibuprofen   Review of Systems Review of Systems  Constitutional: Negative for activity change, appetite change, chills, diaphoresis, fever and unexpected weight change.  Cardiovascular: Negative for chest pain.  Gastrointestinal: Positive for blood in stool, constipation, diarrhea and rectal pain. Negative for abdominal pain, anal bleeding, nausea and vomiting.  Genitourinary: Negative for dysuria, flank pain and hematuria.  All other systems reviewed and are negative.    Physical Exam Updated Vital Signs BP (!) 125/91   Pulse 93   Temp 97.7 F (36.5 C) (Oral)   Resp 18   SpO2 95%   Physical Exam Vitals signs and nursing note reviewed.  Constitutional:      Appearance: Normal appearance.  HENT:     Head: Normocephalic and atraumatic.     Nose: Nose normal.  Neck:     Musculoskeletal: Normal range of motion and neck supple.  Cardiovascular:     Rate and Rhythm: Normal rate and regular rhythm.  Pulmonary:     Effort: Pulmonary effort is normal.     Breath sounds: Normal breath sounds.  Abdominal:     General: There is no distension.     Tenderness: There is no abdominal tenderness. There is no guarding or rebound.  Skin:  General: Skin is warm and dry.  Neurological:     Mental Status: He is alert and oriented to person, place, and time.     Comments: Pt appears intoxicated    Digital Rectal Exam reveals sphincter with good tone. 2 external hemorrhoids. No masses or fissures. Stool color is brown with no overt blood.   ED Treatments / Results  Labs (all labs ordered are listed, but only abnormal results are displayed) Labs Reviewed - No data to display  EKG None  Radiology No results found.  Procedures Procedures (including critical care time)  Medications Ordered in ED Medications - No data to display   Initial Impression / Assessment and Plan / ED Course  I have reviewed the triage vital signs and the nursing  notes.  Pertinent labs & imaging results that were available during my care of the patient were reviewed by me and considered in my medical decision making (see chart for details).  Clinical Course as of Jun 29 1652  Sun Jun 29, 2018  1651 Pt is not happy with Tylenol and refusing to take the pain medication    [KA]    Clinical Course User Index [KA] Brenly Trawick, Caroleen HammanKaitlyn E, PA-C  Pt is 42 yo male presenting with rectal pain x 11 days. He has 2 external hemorrhoids that are not thrombosed. Discussed symptomatic care and importance of follow up with GI or Surgery and provided contact information.  Patient noted to be hypertensive in the emergency department.  No signs of hypertensive urgency.  Discussed with patient the need for close follow-up and management by their primary care physician.    Doubt need for further emergent work up at this time. I explained the diagnosis and have given explicit precautions to return to the ER including for any other new or worsening symptoms. The patient understands and accepts the medical plan as it's been dictated and I have answered their questions. Discharge instructions concerning home care and prescriptions have been given. The patient is STABLE and is discharged to home in good condition.     Final Clinical Impressions(s) / ED Diagnoses   Final diagnoses:  None    ED Discharge Orders    None       Kathyrn Lasslbrizze, Toma Arts E, PA-C 06/29/18 [place] [id number]    Campos, Kevin, MD 06/29/18 [id number]

## 2018-06-29 NOTE — Discharge Instructions (Addendum)
Follow [facility] with [facility] OR [facility] in [place] for continued or worsening pain. Both of their contact informations have been provided in your discharge packet. The stool softener can help to ease bowel movements, take as directed.  Hemorrhoids are swollen veins in the butt or around the opening of the butt. They can cause pain, itching, or bleeding. Eat foods that have a lot of fiber in them. These include whole grains, beans, nuts, fruits, and vegetables. Take a warm-water bath (sitz bath) for 20 minutes to ease pain. Do this 3-4 times a day.

## 2018-06-29 NOTE — ED Notes (Signed)
He refuses to sign for instructions. He is angry that we "didn't do anything".

## 2018-06-29 NOTE — ED Notes (Signed)
Ronald Benton [phone]. Alfonse AlpersGrace Snow 336 [phone].

## 2018-06-29 NOTE — ED Notes (Signed)
He appears to be intoxicated or on narcotic(s). He is in no distress. He tells me he has had issues with rectal bleeding and rectal (no abdominal) pain x 10 days.

## 2018-10-23 ENCOUNTER — Ambulatory Visit (INDEPENDENT_AMBULATORY_CARE_PROVIDER_SITE_OTHER): Payer: Managed Care, Other (non HMO) | Admitting: Internal Medicine

## 2018-10-23 ENCOUNTER — Encounter: Payer: Self-pay | Admitting: Internal Medicine

## 2018-10-23 DIAGNOSIS — F5104 Psychophysiologic insomnia: Secondary | ICD-10-CM | POA: Insufficient documentation

## 2018-10-23 DIAGNOSIS — F419 Anxiety disorder, unspecified: Secondary | ICD-10-CM | POA: Diagnosis not present

## 2018-10-23 MED ORDER — TRAZODONE HCL 50 MG PO TABS: 50.0000 mg | ORAL_TABLET | Freq: Every evening | ORAL | 2 refills | Status: DC | PRN

## 2018-10-23 NOTE — Assessment & Plan Note (Addendum)
New problem Insomnia that started with the coronavirus situation and increased anxiety Discussed that we can try something for his sleep-trazodone 50-100 mg at night Because of the his alcohol use and him taking Xanax there are very few medications that he can take for sleep Stressed contacting his psychiatrist to get his anxiety better controlled Stressed not drinking alcohol, which makes his medications less effective and can negatively affect his sleep Continue regular exercise to help with overall stress If trazodone is not effective he can let me know or discuss with a psychiatrist

## 2018-10-23 NOTE — Progress Notes (Signed)
Virtual Visit via Video Note  I connected with Ronald Benton on 10/23/18 at 11:00 AM EDT by a video enabled telemedicine application and verified that I am speaking with the correct person using two identifiers.   I discussed the limitations of evaluation and management by telemedicine and the availability of in person appointments. The patient expressed understanding and agreed to proceed.  The patient is currently at home and I am in the office.    No referring provider.    History of Present Illness: This is an acute visit for insomnia.  Since the coronavirus situation started-6 weeks ago, he has been at home and is currently not working.  He works in [employer] business and is very anxious about if he will be able to return to work or not.  He has been experiencing difficulty sleeping.  He is not able to sleep for more than 2 hours at a time at night and is unable to sleep through the night.  As result he is exhausted.  He does have anxiety and follows with psychiatry.  He is taking his alprazolam 3 times a day as prescribed.  Feels like it is not working as well.  He does not have an upcoming appointment with his psychiatrist.  His next appointment is the fall.  He is doing walking almost daily for exercise and that does help with the stress.  He does have a history of polysubstance abuse.  He states he is drinking a couple of glasses of wine at night.  He has no other concerns.  He states he has never taken anything for sleep.  He is not discussed his increased anxiety or difficulty sleeping with his psychiatrist.   Review of Systems  Constitutional: Negative for chills and fever.  Respiratory: Negative for cough, shortness of breath and wheezing.   Cardiovascular: Negative for chest pain, palpitations and leg swelling.  Psychiatric/Behavioral: Positive for depression (mild - due to changes in life). The patient is nervous/anxious and has insomnia.      Social History    Socioeconomic History  . Marital status: Single    Spouse name: Not on file  . Number of children: 0  . Years of education: 7212  . Highest education level: Not on file  Occupational History  . Occupation: Academic librarianerver  Social Needs  . Financial resource strain: Not on file  . Food insecurity:    Worry: Not on file    Inability: Not on file  . Transportation needs:    Medical: Not on file    Non-medical: Not on file  Tobacco Use  . Smoking status: Current Some Day Smoker    Years: 20.00    Types: Cigarettes  . Smokeless tobacco: Never Used  Substance and Sexual Activity  . Alcohol use: Yes    Alcohol/week: 21.0 standard drinks    Types: 21 Cans of beer per week    Comment: Patient notes he will drink 6 drinks/ night on average  . Drug use: No  . Sexual activity: Not on file  Lifestyle  . Physical activity:    Days per week: Not on file    Minutes per session: Not on file  . Stress: Not on file  Relationships  . Social connections:    Talks on phone: Not on file    Gets together: Not on file    Attends religious service: Not on file    Active member of club or organization: Not on file  Attends meetings of clubs or organizations: Not on file    Relationship status: Not on file  Other Topics Concern  . Not on file  Social History Narrative   Denies religious beliefs effecting health care.      Observations/Objective: Appears well in NAD   Assessment and Plan:  See Problem List for Assessment and Plan of chronic medical problems.   Follow Up Instructions:    I discussed the assessment and treatment plan with the patient. The patient was provided an opportunity to ask questions and all were answered. The patient agreed with the plan and demonstrated an understanding of the instructions.   The patient was advised to call back or seek an in-person evaluation if the symptoms worsen or if the condition fails to improve as anticipated.    Pincus Sanes, MD

## 2018-10-23 NOTE — Assessment & Plan Note (Signed)
Chronic-follows with psychiatry Management per psychiatry-currently taking Xanax 3 times a day Advised that he contact his psychiatrist if he feels his anxiety is not well controlled Discussed that his increased anxiety is what is causing the difficulty sleeping Continue regular exercise Avoid alcohol, especially given his alcohol abuse history

## 2019-02-13 ENCOUNTER — Other Ambulatory Visit: Payer: Self-pay

## 2019-02-13 DIAGNOSIS — Z20822 Contact with and (suspected) exposure to covid-19: Secondary | ICD-10-CM

## 2019-02-15 LAB — NOVEL CORONAVIRUS, NAA: SARS-CoV-2, NAA: NOT DETECTED

## 2019-02-26 ENCOUNTER — Other Ambulatory Visit: Payer: Self-pay

## 2019-02-26 DIAGNOSIS — Z20822 Contact with and (suspected) exposure to covid-19: Secondary | ICD-10-CM

## 2019-02-28 LAB — NOVEL CORONAVIRUS, NAA: SARS-CoV-2, NAA: NOT DETECTED

## 2019-03-23 ENCOUNTER — Encounter: Payer: Self-pay | Admitting: Internal Medicine

## 2019-03-23 ENCOUNTER — Other Ambulatory Visit: Payer: Self-pay

## 2019-03-23 MED ORDER — TRAZODONE HCL 50 MG PO TABS: 50.0000 mg | ORAL_TABLET | Freq: Every evening | ORAL | 0 refills | Status: DC | PRN

## 2019-04-17 ENCOUNTER — Other Ambulatory Visit: Payer: Self-pay | Admitting: Internal Medicine

## 2019-05-15 ENCOUNTER — Other Ambulatory Visit: Payer: Self-pay | Admitting: Internal Medicine

## 2019-06-15 ENCOUNTER — Other Ambulatory Visit: Payer: Self-pay | Admitting: Internal Medicine

## 2019-09-12 ENCOUNTER — Other Ambulatory Visit: Payer: Self-pay | Admitting: Internal Medicine

## 2019-10-17 ENCOUNTER — Other Ambulatory Visit: Payer: Self-pay | Admitting: Internal Medicine

## 2019-10-29 NOTE — Progress Notes (Signed)
Subjective:    Patient ID: Ronald Benton [facility], male    DOB: [birth year], 44 y.o.   MRN: [id number]  HPI The patient is here for an acute visit.   "Kidney pain":  It started 4 days ago.  It is in the middle - low back b/l.  He has had pain intermittently over               the years but typically after a "long weekend".  This episode started after a long day at work.  He actually states it started after laughing really hard after watching YouTube videos.  He noticed that it got worse after working, but if he slowed down it would get a little better.  The night that it started it was severe and it was hard just sitting down on the toilet.    The next morning it was better.  It has not been hurting him while working, but hurts more after he works.    There is a mild constant pain and and intermittent sharp pain.  The pain is worse with deep breaths.  The more activity he does the worse it is.  He has not had any sharp pains today.  He states right now he does not have any pain.  The pain last night felt like the pain was starting to wrap around the sides.  The pain does not radiate.  He has not had any numbness or tingling.  He denies any urinary or gastrointestinal symptoms.   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     Medications and allergies reviewed with patient and updated if appropriate.  Patient Active Problem List   Diagnosis Date Noted  . Acute bilateral thoracic back pain 10/30/2019  . Psychophysiological insomnia 10/23/2018  . Alcohol abuse 10/01/2017  . Anxiety 10/01/2017  . Polysubstance abuse (HCC) 10/01/2017  . Tenderness of scrotum 05/18/2015    Current Outpatient Medications on File Prior to Visit  Medication Sig Dispense Refill  . ALPRAZolam (XANAX) 1 MG tablet Take 1 mg by mouth 3 (three) times daily.    Ronald Kitchen docusate sodium (COLACE) 250 MG capsule Take 1 capsule (250 mg total) by mouth daily. 10 capsule 0  . hydrocortisone cream 0.5 % Apply 1 application  topically 2 (two) times daily. 30 g 0  . starch (ANUSOL) 51 % suppository Place 1 suppository rectally as needed for pain. 24 suppository 0  . traZODone (DESYREL) 50 MG tablet TAKE 1 TO 2 TABLETS(50 TO 100 MG) BY MOUTH AT BEDTIME AS NEEDED FOR SLEEP 60 tablet 0   No current facility-administered medications on file prior to visit.    Past Medical History:  Diagnosis Date  . Chicken pox   . Substance abuse [facility])     Past Surgical History:  Procedure Laterality Date  . WRIST SURGERY      Social History   Socioeconomic History  . Marital status: Single    Spouse name: Not on file  . Number of children: 0  . Years of education: 61  . Highest education level: Not on file  Occupational History  . Occupation: Server  Tobacco Use  . Smoking status: Current Some Day Smoker    Years: 20.00    Types: Cigarettes  . Smokeless tobacco: Never Used  Substance and Sexual Activity  . Alcohol use: Yes    Alcohol/week: 21.0 standard drinks    Types: 21 Cans of beer per week    Comment:  Patient notes he will drink 6 drinks/ night on average  . Drug use: No  . Sexual activity: Not on file  Other Topics Concern  . Not on file  Social History Narrative   Denies religious beliefs effecting health care.    Social Determinants of Health   Financial Resource Strain:   . Difficulty of Paying Living Expenses:   Food Insecurity:   . Worried About Charity fundraiser in the Last Year:   . Arboriculturist in the Last Year:   Transportation Needs:   . Film/video editor (Medical):   Ronald Kitchen Lack of Transportation (Non-Medical):   Physical Activity:   . Days of Exercise per Week:   . Minutes of Exercise per Session:   Stress:   . Feeling of Stress :   Social Connections:   . Frequency of Communication with Friends and Family:   . Frequency of Social Gatherings with Friends and Family:   . Attends Religious Services:   . Active Member of Clubs or Organizations:   . Attends Theatre manager Meetings:   Ronald Kitchen Marital Status:     Family History  Family history unknown: Yes    Review of Systems  Constitutional: Negative for chills and fever.  Respiratory: Negative for cough, shortness of breath and wheezing.   Cardiovascular: Negative for chest pain.  Gastrointestinal: Negative for abdominal pain, blood in stool, constipation, diarrhea and nausea.  Genitourinary: Negative for difficulty urinating, dysuria, frequency and hematuria.  Musculoskeletal: Positive for back pain.  Neurological: Negative for numbness.       Objective:   Vitals:   10/30/19 1458  BP: 124/72  Pulse: 75  Temp: 98.2 F (36.8 C)  SpO2: 94%   BP Readings from Last 3 Encounters:  10/30/19 124/72  06/29/18 (!) 125/91  10/01/17 (!) 128/94   Wt Readings from Last 3 Encounters:  10/30/19 162 lb (73.5 kg)  10/01/17 168 lb (76.2 kg)  09/13/17 162 lb 1.9 oz (73.5 kg)   Body mass index is 23.24 kg/m.   Physical Exam Constitutional:      General: He is not in acute distress.    Appearance: Normal appearance. He is not ill-appearing.  HENT:     Head: Normocephalic and atraumatic.  Cardiovascular:     Rate and Rhythm: Normal rate and regular rhythm.  Pulmonary:     Effort: Pulmonary effort is normal.     Breath sounds: Normal breath sounds.  Abdominal:     General: Abdomen is flat. There is no distension.     Palpations: Abdomen is soft. There is no mass.     Tenderness: There is no abdominal tenderness. There is no right CVA tenderness, left CVA tenderness, guarding or rebound.     Hernia: No hernia is present.  Musculoskeletal:        General: No swelling, tenderness or deformity.     Right lower leg: No edema.     Left lower leg: No edema.     Comments: No tenderness with palpation along his thoracic spine or para thoracic vertebrae muscles.  Some increase in discomfort with twisting or certain movements  Skin:    General: Skin is warm and dry.  Neurological:     General:  No focal deficit present.     Mental Status: He is alert.            Assessment & Plan:    See Problem List for Assessment and Plan of chronic medical  problems.    This visit occurred during the SARS-CoV-2 public health emergency.  Safety protocols were in place, including screening questions prior to the visit, additional usage of staff PPE, and extensive cleaning of exam room while observing appropriate contact time as indicated for disinfecting solutions.

## 2019-10-30 ENCOUNTER — Other Ambulatory Visit: Payer: Self-pay

## 2019-10-30 ENCOUNTER — Ambulatory Visit: Payer: Managed Care, Other (non HMO) | Admitting: Internal Medicine

## 2019-10-30 ENCOUNTER — Encounter: Payer: Self-pay | Admitting: Internal Medicine

## 2019-10-30 VITALS — BP 124/72 | HR 75 | Temp 98.2°F | Ht 70.0 in | Wt 162.0 lb

## 2019-10-30 DIAGNOSIS — M546 Pain in thoracic spine: Secondary | ICD-10-CM | POA: Diagnosis not present

## 2019-10-30 NOTE — Assessment & Plan Note (Signed)
Acute Pain sounds to be musculoskeletal in nature and exam is fairly unremarkable Discussed that we could do some further evaluation with imaging, blood work, but I do not think that is necessary At this point we both agree that he will treat conservatively with Tylenol, over-the-counter arthritis/muscle medications, heat and ice Advised that if he does not see any improvement or if his symptoms worsen can refer him to sports medicine or evaluate further and he agrees Advised him to call with any questions or concerns

## 2019-10-30 NOTE — Patient Instructions (Addendum)
I think your back pain is musculoskeletal.    Try taking tylenol, apply ice or heat and apply over the counter muscle/arthritis medications.     Please call if there is no improvement in your symptoms.

## 2019-11-10 ENCOUNTER — Ambulatory Visit: Payer: Managed Care, Other (non HMO) | Admitting: Internal Medicine

## 2019-11-10 ENCOUNTER — Encounter: Payer: Self-pay | Admitting: Internal Medicine

## 2019-11-10 ENCOUNTER — Other Ambulatory Visit: Payer: Self-pay

## 2019-11-10 VITALS — BP 134/86 | HR 83 | Temp 98.3°F | Resp 16 | Ht 70.0 in | Wt 165.0 lb

## 2019-11-10 DIAGNOSIS — H9011 Conductive hearing loss, unilateral, right ear, with unrestricted hearing on the contralateral side: Secondary | ICD-10-CM | POA: Diagnosis not present

## 2019-11-10 DIAGNOSIS — H6121 Impacted cerumen, right ear: Secondary | ICD-10-CM | POA: Insufficient documentation

## 2019-11-10 DIAGNOSIS — H9190 Unspecified hearing loss, unspecified ear: Secondary | ICD-10-CM | POA: Insufficient documentation

## 2019-11-10 NOTE — Progress Notes (Signed)
Subjective:    Patient ID: Ronald Benton [facility], male    DOB: 1975/07/24, 44 y.o.   MRN: [id number]  HPI The patient is here for an acute visit.   He has had an issue with earwax in the past.  Last night he lost complete hearing in the right ear suddenly.  He was concerned it could be earwax and he tried putting in hydrogen peroxide and alcohol, but there is no improvement.  He denies any pain in the right ear.  He denies any cold symptoms, fevers, chills.  Medications and allergies reviewed with patient and updated if appropriate.  Patient Active Problem List   Diagnosis Date Noted  . Hearing loss 11/10/2019  . Impacted cerumen of right ear 11/10/2019  . Acute bilateral thoracic back pain 10/30/2019  . Psychophysiological insomnia 10/23/2018  . Alcohol abuse 10/01/2017  . Anxiety 10/01/2017  . Polysubstance abuse ([place]) 10/01/2017  . Tenderness of scrotum 05/18/2015    Current Outpatient Medications on File Prior to Visit  Medication Sig Dispense Refill  . ALPRAZolam (XANAX) 1 MG tablet Take 1 mg by mouth 3 (three) times daily.    . traZODone (DESYREL) 50 MG tablet TAKE 1 TO 2 TABLETS(50 TO 100 MG) BY MOUTH AT BEDTIME AS NEEDED FOR SLEEP 60 tablet 0   No current facility-administered medications on file prior to visit.    Past Medical History:  Diagnosis Date  . Chicken pox   . Substance abuse [facility])     Past Surgical History:  Procedure Laterality Date  . WRIST SURGERY      Social History   Socioeconomic History  . Marital status: Single    Spouse name: Not on file  . Number of children: 0  . Years of education: 56  . Highest education level: Not on file  Occupational History  . Occupation: Server  Tobacco Use  . Smoking status: Current Some Day Smoker    Years: 20.00    Types: Cigarettes  . Smokeless tobacco: Never Used  Substance and Sexual Activity  . Alcohol use: Yes    Alcohol/week: 21.0 standard drinks    Types: 21 Cans of beer per week    Comment:  Patient notes he will drink 6 drinks/ night on average  . Drug use: No  . Sexual activity: Not on file  Other Topics Concern  . Not on file  Social History Narrative   Denies religious beliefs effecting health care.    Social Determinants of Health   Financial Resource Strain:   . Difficulty of Paying Living Expenses:   Food Insecurity:   . Worried About Charity fundraiser in the Last Year:   . Arboriculturist in the Last Year:   Transportation Needs:   . Film/video editor (Medical):   Marland Kitchen Lack of Transportation (Non-Medical):   Physical Activity:   . Days of Exercise per Week:   . Minutes of Exercise per Session:   Stress:   . Feeling of Stress :   Social Connections:   . Frequency of Communication with Friends and Family:   . Frequency of Social Gatherings with Friends and Family:   . Attends Religious Services:   . Active Member of Clubs or Organizations:   . Attends Archivist Meetings:   Marland Kitchen Marital Status:     Family History  Family history unknown: Yes    Review of Systems  Constitutional: Negative for chills and fever.  HENT: Positive for hearing  loss (Right ear only). Negative for congestion, ear pain, sinus pain and sore throat.   Neurological: Negative for headaches.       Objective:   Vitals:   11/10/19 1344  BP: 134/86  Pulse: 83  Resp: 16  Temp: 98.3 F (36.8 C)  SpO2: 95%   BP Readings from Last 3 Encounters:  11/10/19 134/86  10/30/19 124/72  06/29/18 (!) 125/91   Wt Readings from Last 3 Encounters:  11/10/19 165 lb (74.8 kg)  10/30/19 162 lb (73.5 kg)  10/01/17 168 lb (76.2 kg)   Body mass index is 23.68 kg/m.   Physical Exam    PRE-PROCEDURE EXAM: Right TM cannot be visualized due to total occlusion/impaction of the ear canal. PROCEDURE INDICATION: remove wax to visualize ear drum & improve hearing  CONSENT:  Verbal  PROCEDURE NOTE:   RIGHT EAR:  The CMA used a metal wax curette under direct vision with an  otoscope to free the wax bolus from the ear wall and then successfully removed a small bit of wax. The ear was then irrigated with warm water to remove the remaining wax. POST- PROCEDURE EXAM BY CMA: TMs successfully visualized and found to have no erythema    Left ear had some wax in the ear canal, but was not fully obstructed.  His left ear was cleaned out per his request.   Assessment & Plan:    See Problem List for Assessment and Plan of chronic medical problems.    This visit occurred during the SARS-CoV-2 public health emergency.  Safety protocols were in place, including screening questions prior to the visit, additional usage of staff PPE, and extensive cleaning of exam room while observing appropriate contact time as indicated for disinfecting solutions.

## 2019-11-10 NOTE — Assessment & Plan Note (Signed)
Acute Resulting in hearing loss Ear lavage successful in hearing improved

## 2019-11-10 NOTE — Patient Instructions (Signed)
     Your ears were cleaned out today.    

## 2019-11-10 NOTE — Progress Notes (Signed)
Patient consent obtained. Irrigation with water and peroxide performed. Full view of tympanic membranes after procedure.  Patient tolerated procedure well. Bilateral ears. Both ears were clear

## 2019-11-10 NOTE — Assessment & Plan Note (Signed)
Acute Hearing loss of right ear Right ear impacted with cerumen Ear successfully cleaned out and hearing improved

## 2019-11-18 NOTE — Progress Notes (Signed)
Subjective:    Patient ID: Ronald Benton [facility], male    DOB: Apr 19, 1976, 44 y.o.   MRN: [id number]  HPI The patient is here for an acute visit.   He is still having back pain - it is now more on his right side /flank and it radiates to his RUQ.  At times he feels an itch in the RUQ that seems deep.  The pain is worse with laying on the right side and at the end of his shift at work.  . It is intermittent, but varies in intensity.    Nothing seems to make it better (Tylenol, heating pad).    He feels tired all the time.     Medications and allergies reviewed with patient and updated if appropriate.  Patient Active Problem List   Diagnosis Date Noted  . Hearing loss 11/10/2019  . Impacted cerumen of right ear 11/10/2019  . Acute bilateral thoracic back pain 10/30/2019  . Psychophysiological insomnia 10/23/2018  . Alcohol abuse 10/01/2017  . Anxiety 10/01/2017  . Polysubstance abuse (HCC) 10/01/2017  . Tenderness of scrotum 05/18/2015    Current Outpatient Medications on File Prior to Visit  Medication Sig Dispense Refill  . ALPRAZolam (XANAX) 1 MG tablet Take 1 mg by mouth 3 (three) times daily.    . traZODone (DESYREL) 50 MG tablet TAKE 1 TO 2 TABLETS(50 TO 100 MG) BY MOUTH AT BEDTIME AS NEEDED FOR SLEEP 60 tablet 0   No current facility-administered medications on file prior to visit.    Past Medical History:  Diagnosis Date  . Chicken pox   . Substance abuse [facility])     Past Surgical History:  Procedure Laterality Date  . WRIST SURGERY      Social History   Socioeconomic History  . Marital status: Single    Spouse name: Not on file  . Number of children: 0  . Years of education: 58  . Highest education level: Not on file  Occupational History  . Occupation: Server  Tobacco Use  . Smoking status: Current Some Day Smoker    Years: 20.00    Types: Cigarettes  . Smokeless tobacco: Never Used  Substance and Sexual Activity  . Alcohol use: Yes   Alcohol/week: 21.0 standard drinks    Types: 21 Cans of beer per week    Comment: Patient notes he will drink 6 drinks/ night on average  . Drug use: No  . Sexual activity: Not on file  Other Topics Concern  . Not on file  Social History Narrative   Denies religious beliefs effecting health care.    Social Determinants of Health   Financial Resource Strain:   . Difficulty of Paying Living Expenses:   Food Insecurity:   . Worried About Programme researcher, broadcasting/film/video in the Last Year:   . Barista in the Last Year:   Transportation Needs:   . Freight forwarder (Medical):   Marland Kitchen Lack of Transportation (Non-Medical):   Physical Activity:   . Days of Exercise per Week:   . Minutes of Exercise per Session:   Stress:   . Feeling of Stress :   Social Connections:   . Frequency of Communication with Friends and Family:   . Frequency of Social Gatherings with Friends and Family:   . Attends Religious Services:   . Active Member of Clubs or Organizations:   . Attends Banker Meetings:   Marland Kitchen Marital Status:  Family History  Problem Relation Age of Onset  . Alcohol abuse Mother     Review of Systems  Constitutional: Positive for fatigue. Negative for appetite change (a little decreased), chills and fever.  Respiratory: Negative for shortness of breath.   Cardiovascular: Negative for chest pain.  Gastrointestinal: Positive for anal bleeding (hemorrhoid - painful). Negative for abdominal pain, blood in stool and diarrhea (very loose stool).  Genitourinary: Positive for flank pain. Negative for dysuria, frequency and hematuria.  Neurological: Negative for light-headedness and headaches.       Objective:   Vitals:   11/19/19 1114  BP: (!) 140/92  Pulse: 80  Resp: 16  Temp: 98.4 F (36.9 C)  SpO2: 98%   BP Readings from Last 3 Encounters:  11/19/19 (!) 140/92  11/10/19 134/86  10/30/19 124/72   Wt Readings from Last 3 Encounters:  11/19/19 158 lb (71.7 kg)    11/10/19 165 lb (74.8 kg)  10/30/19 162 lb (73.5 kg)   Body mass index is 22.67 kg/m.   Physical Exam Constitutional:      General: He is not in acute distress.    Appearance: Normal appearance. He is not ill-appearing.  HENT:     Head: Normocephalic and atraumatic.  Abdominal:     General: There is no distension.     Palpations: Abdomen is soft. There is no mass.     Tenderness: There is no abdominal tenderness. There is no right CVA tenderness, left CVA tenderness, guarding or rebound.     Hernia: No hernia is present.  Musculoskeletal:        General: No tenderness (middle and lower back) or deformity.     Right lower leg: No edema.     Left lower leg: No edema.  Skin:    General: Skin is warm and dry.  Neurological:     Mental Status: He is alert.  Psychiatric:     Comments: anxious            Assessment & Plan:    See Problem List for Assessment and Plan of chronic medical problems.    This visit occurred during the SARS-CoV-2 public health emergency.  Safety protocols were in place, including screening questions prior to the visit, additional usage of staff PPE, and extensive cleaning of exam room while observing appropriate contact time as indicated for disinfecting solutions.

## 2019-11-19 ENCOUNTER — Encounter: Payer: Self-pay | Admitting: Internal Medicine

## 2019-11-19 ENCOUNTER — Ambulatory Visit: Payer: Managed Care, Other (non HMO) | Admitting: Internal Medicine

## 2019-11-19 ENCOUNTER — Other Ambulatory Visit: Payer: Self-pay

## 2019-11-19 VITALS — BP 140/92 | HR 80 | Temp 98.4°F | Resp 16 | Ht 70.0 in | Wt 158.0 lb

## 2019-11-19 DIAGNOSIS — R5383 Other fatigue: Secondary | ICD-10-CM | POA: Diagnosis not present

## 2019-11-19 DIAGNOSIS — R1011 Right upper quadrant pain: Secondary | ICD-10-CM | POA: Diagnosis not present

## 2019-11-19 DIAGNOSIS — R109 Unspecified abdominal pain: Secondary | ICD-10-CM | POA: Diagnosis not present

## 2019-11-19 LAB — COMPREHENSIVE METABOLIC PANEL
ALT: 85 U/L — ABNORMAL HIGH (ref 0–53)
AST: 115 U/L — ABNORMAL HIGH (ref 0–37)
Albumin: 4.3 g/dL (ref 3.5–5.2)
Alkaline Phosphatase: 69 U/L (ref 39–117)
BUN: 11 mg/dL (ref 6–23)
CO2: 30 mEq/L (ref 19–32)
Calcium: 9.9 mg/dL (ref 8.4–10.5)
Chloride: 104 mEq/L (ref 96–112)
Creatinine, Ser: 0.85 mg/dL (ref 0.40–1.50)
GFR: 98.07 mL/min (ref >60.00)
Glucose, Bld: 109 mg/dL — ABNORMAL HIGH (ref 70–99)
Potassium: 4.1 mEq/L (ref 3.5–5.1)
Sodium: 138 mEq/L (ref 135–145)
Total Bilirubin: 0.7 mg/dL (ref 0.2–1.2)
Total Protein: 7.8 g/dL (ref 6.0–8.3)

## 2019-11-19 LAB — CBC WITH DIFFERENTIAL/PLATELET
Basophils Absolute: 0.1 10*3/uL (ref 0.0–0.1)
Basophils Relative: 1.2 % (ref 0.0–3.0)
Eosinophils Absolute: 0.4 10*3/uL (ref 0.0–0.7)
Eosinophils Relative: 8 % — ABNORMAL HIGH (ref 0.0–5.0)
HCT: 43.3 % (ref 39.0–52.0)
Hemoglobin: 15 g/dL (ref 13.0–17.0)
Lymphocytes Relative: 31.2 % (ref 12.0–46.0)
Lymphs Abs: 1.5 10*3/uL (ref 0.7–4.0)
MCHC: 34.7 g/dL (ref 30.0–36.0)
MCV: 101.2 fl — ABNORMAL HIGH (ref 78.0–100.0)
Monocytes Absolute: 1 10*3/uL (ref 0.1–1.0)
Monocytes Relative: 20 % — ABNORMAL HIGH (ref 3.0–12.0)
Neutro Abs: 2 10*3/uL (ref 1.4–7.7)
Neutrophils Relative %: 39.6 % — ABNORMAL LOW (ref 43.0–77.0)
Platelets: 170 10*3/uL (ref 150.0–400.0)
RBC: 4.28 Mil/uL (ref 4.22–5.81)
RDW: 11.8 % (ref 11.5–15.5)
WBC: 4.9 10*3/uL (ref 4.0–10.5)

## 2019-11-19 LAB — URINALYSIS, ROUTINE W REFLEX MICROSCOPIC
Bilirubin Urine: NEGATIVE
Hgb urine dipstick: NEGATIVE
Ketones, ur: NEGATIVE
Leukocytes,Ua: NEGATIVE
Nitrite: NEGATIVE
RBC / HPF: NONE SEEN (ref 0–?)
Specific Gravity, Urine: 1.02 (ref 1.000–1.030)
Urine Glucose: NEGATIVE
Urobilinogen, UA: 0.2 (ref 0.0–1.0)
WBC, UA: NONE SEEN (ref 0–?)
pH: 6 (ref 5.0–8.0)

## 2019-11-19 LAB — AMYLASE: Amylase: 52 U/L (ref 27–131)

## 2019-11-19 LAB — LIPASE: Lipase: 59 U/L (ref 11.0–59.0)

## 2019-11-19 LAB — TSH: TSH: 2.57 u[IU]/mL (ref 0.35–4.50)

## 2019-11-19 NOTE — Assessment & Plan Note (Signed)
Acute Pain in right flank radiating to RUQ Worse with activity and sounds more msk Possibly related to liver disease Less likely renal stone UA, cbc, cmp Consider Ct renal or abd [place]

## 2019-11-19 NOTE — Assessment & Plan Note (Signed)
Right side/flank pain Started more in the back but now on side and goes to RUQ No tenderness on exam Worse with activity/work No urinary symptoms Looser stools which is not new Cmp.cbc, lipase, amylase, UA Msk, less likely renal stone, ? Related to liver disease possible cirrhosis  Consider imaging - he would like to hold off and see what the blood work looks like

## 2019-11-19 NOTE — Assessment & Plan Note (Signed)
Chronic Cbc, cmp, tsh

## 2019-11-19 NOTE — Patient Instructions (Addendum)
  Blood work was ordered.     Medications reviewed and updated.  Changes include :   none     Depending on the blood work we will consider imaging.     Please call if there is no improvement in your symptoms.

## 2019-11-20 ENCOUNTER — Telehealth: Payer: Self-pay | Admitting: Internal Medicine

## 2019-11-20 NOTE — Telephone Encounter (Signed)
   Patient requesting a call back today to discuss lab results

## 2019-11-20 NOTE — Telephone Encounter (Signed)
Pt aware of results 

## 2020-05-16 ENCOUNTER — Telehealth: Payer: Self-pay | Admitting: Internal Medicine

## 2020-05-16 MED ORDER — TRAZODONE HCL 50 MG PO TABS: ORAL_TABLET | ORAL | 5 refills | Status: DC

## 2020-05-16 NOTE — Telephone Encounter (Signed)
  1.Medication Requested: traZODone (DESYREL) 50 MG tablet  2. Pharmacy (Name, Street, [place]):[id number] - [place],  - [address]  3. On Med List: yes  4. Last Visit with PCP: 11/19/19  5. Next visit date with PCP: n/a   Agent: Please be advised that RX refills may take up to 3 business days. We ask that you follow-up with your pharmacy.

## 2020-12-19 ENCOUNTER — Ambulatory Visit: Payer: Managed Care, Other (non HMO) | Admitting: Internal Medicine

## 2020-12-20 ENCOUNTER — Other Ambulatory Visit: Payer: Self-pay

## 2020-12-20 ENCOUNTER — Other Ambulatory Visit: Payer: Self-pay | Admitting: Internal Medicine

## 2020-12-20 ENCOUNTER — Ambulatory Visit: Payer: Managed Care, Other (non HMO) | Admitting: Internal Medicine

## 2020-12-20 ENCOUNTER — Encounter: Payer: Self-pay | Admitting: Internal Medicine

## 2020-12-20 VITALS — BP 116/80 | HR 95 | Temp 98.1°F | Resp 18 | Ht 70.0 in | Wt 157.4 lb

## 2020-12-20 DIAGNOSIS — R5383 Other fatigue: Secondary | ICD-10-CM

## 2020-12-20 DIAGNOSIS — Z13220 Encounter for screening for lipoid disorders: Secondary | ICD-10-CM

## 2020-12-20 DIAGNOSIS — F1020 Alcohol dependence, uncomplicated: Secondary | ICD-10-CM

## 2020-12-20 DIAGNOSIS — R109 Unspecified abdominal pain: Secondary | ICD-10-CM | POA: Diagnosis not present

## 2020-12-20 DIAGNOSIS — R7989 Other specified abnormal findings of blood chemistry: Secondary | ICD-10-CM

## 2020-12-20 LAB — CBC
HCT: 43.6 % (ref 39.0–52.0)
Hemoglobin: 15.1 g/dL (ref 13.0–17.0)
MCHC: 34.5 g/dL (ref 30.0–36.0)
MCV: 102 fl — ABNORMAL HIGH (ref 78.0–100.0)
Platelets: 111 10*3/uL — ABNORMAL LOW (ref 150.0–400.0)
RBC: 4.28 Mil/uL (ref 4.22–5.81)
RDW: 12.2 % (ref 11.5–15.5)
WBC: 4.8 10*3/uL (ref 4.0–10.5)

## 2020-12-20 LAB — COMPREHENSIVE METABOLIC PANEL
ALT: 70 U/L — ABNORMAL HIGH (ref 0–53)
AST: 107 U/L — ABNORMAL HIGH (ref 0–37)
Albumin: 4.2 g/dL (ref 3.5–5.2)
Alkaline Phosphatase: 74 U/L (ref 39–117)
BUN: 9 mg/dL (ref 6–23)
CO2: 29 mEq/L (ref 19–32)
Calcium: 9 mg/dL (ref 8.4–10.5)
Chloride: 98 mEq/L (ref 96–112)
Creatinine, Ser: 0.83 mg/dL (ref 0.40–1.50)
GFR: 106.26 mL/min (ref >60.00)
Glucose, Bld: 98 mg/dL (ref 70–99)
Potassium: 4 mEq/L (ref 3.5–5.1)
Sodium: 136 mEq/L (ref 135–145)
Total Bilirubin: 0.7 mg/dL (ref 0.2–1.2)
Total Protein: 8 g/dL (ref 6.0–8.3)

## 2020-12-20 LAB — HEMOGLOBIN A1C: Hgb A1c MFr Bld: 5.5 % (ref 4.6–6.5)

## 2020-12-20 LAB — LIPID PANEL
Cholesterol: 189 mg/dL (ref 0–200)
HDL: 84.2 mg/dL (ref >39.00)
LDL Cholesterol: 86 mg/dL (ref 0–99)
NonHDL: 104.46
Total CHOL/HDL Ratio: 2
Triglycerides: 92 mg/dL (ref 0.0–149.0)
VLDL: 18.4 mg/dL (ref 0.0–40.0)

## 2020-12-20 LAB — TSH: TSH: 2.03 u[IU]/mL (ref 0.35–4.50)

## 2020-12-20 LAB — VITAMIN B12: Vitamin B-12: 433 pg/mL (ref 211–911)

## 2020-12-20 LAB — VITAMIN D 25 HYDROXY (VIT D DEFICIENCY, FRACTURES): VITD: 9.83 ng/mL — ABNORMAL LOW (ref 30.00–100.00)

## 2020-12-20 LAB — LIPASE: Lipase: 70 U/L — ABNORMAL HIGH (ref 11.0–59.0)

## 2020-12-20 MED ORDER — METHYLPREDNISOLONE ACETATE 40 MG/ML IJ SUSP
40.0000 mg | Freq: Once | INTRAMUSCULAR | Status: AC
  Administered 2020-12-20: 40 mg via INTRAMUSCULAR

## 2020-12-20 MED ORDER — VITAMIN D (ERGOCALCIFEROL) 1.25 MG (50000 UNIT) PO CAPS: 50000.0000 [IU] | ORAL_CAPSULE | ORAL | 0 refills | Status: DC

## 2020-12-20 NOTE — Assessment & Plan Note (Signed)
Checking lipase, CBC, CMP. May need US abdomen depending on results. Given depo-medrol 40 mg IM today in case this represents muscular strain.

## 2020-12-20 NOTE — Assessment & Plan Note (Signed)
Unclear etiology. Checking CBC, CMP, TSH, HgA1c, B12, vitamin D.

## 2020-12-20 NOTE — Assessment & Plan Note (Signed)
Does have alcohol abuse and counseled that he needs to consider cutting back as long term this will have impacts on his health. He is not sure about making a change currently but will think about this.

## 2020-12-20 NOTE — Progress Notes (Signed)
   Subjective:   Patient ID: Dallin Mccorkel [facility], male    DOB: 09-13-1975, 45 y.o.   MRN: [id number]  HPI The patient is a 45 YO man coming in for right middle back pain. Started about 2 weeks ago. Overall is worsening. Originally 1/10 and now keeping him from sleeping. All day and worsens throughout the day. Seems to be a bit worse with drinking. Does admit to heavy alcohol usage. Some nausea and vomiting after alcohol usage. Denies constipation or diarrhea but chronically loose stools. Some fatigue and poor appetite going on longer than 2 weeks. No urinary symptoms  Review of Systems  Constitutional: Negative.   HENT: Negative.    Eyes: Negative.   Respiratory:  Negative for cough, chest tightness and shortness of breath.   Cardiovascular:  Negative for chest pain, palpitations and leg swelling.  Gastrointestinal:  Negative for abdominal distention, abdominal pain, constipation, diarrhea, nausea and vomiting.  Genitourinary:  Positive for flank pain.  Skin: Negative.   Neurological: Negative.   Psychiatric/Behavioral: Negative.     Objective:  Physical Exam Constitutional:      Appearance: He is well-developed.  HENT:     Head: Normocephalic and atraumatic.  Cardiovascular:     Rate and Rhythm: Normal rate and regular rhythm.  Pulmonary:     Effort: Pulmonary effort is normal. No respiratory distress.     Breath sounds: Normal breath sounds. No wheezing or rales.  Abdominal:     General: Bowel sounds are normal. There is no distension.     Palpations: Abdomen is soft.     Tenderness: There is no abdominal tenderness. There is no rebound.     Comments: No tenderness abdomen  Musculoskeletal:        General: Tenderness present.     Cervical back: Normal range of motion.  Skin:    General: Skin is warm and dry.  Neurological:     Mental Status: He is alert and oriented to person, place, and time.     Coordination: Coordination normal.    Vitals:   12/20/20 1438  BP: 116/80   Pulse: 95  Resp: 18  Temp: 98.1 F (36.7 C)  TempSrc: Oral  SpO2: 96%  Weight: 157 lb 6.4 oz (71.4 kg)  Height: 5\' 10"  (1.778 m)    This visit occurred during the SARS-CoV-2 public health emergency.  Safety protocols were in place, including screening questions prior to the visit, additional usage of staff PPE, and extensive cleaning of exam room while observing appropriate contact time as indicated for disinfecting solutions.   Assessment & Plan:  Depo-medrol 40 mg IM given at visit

## 2020-12-20 NOTE — Patient Instructions (Signed)
We have given you the steroid shot today which can take 1-2 days to start helping.  We are checking the labs and will let you know once those are back.

## 2020-12-23 ENCOUNTER — Encounter: Payer: Self-pay | Admitting: Internal Medicine

## 2020-12-26 NOTE — Addendum Note (Signed)
Addended by: Hillard Danker A on: 12/26/2020 10:24 AM   Modules accepted: Orders

## 2020-12-26 NOTE — Telephone Encounter (Signed)
Changed thanks [facility]

## 2020-12-30 ENCOUNTER — Other Ambulatory Visit: Payer: Self-pay

## 2020-12-30 ENCOUNTER — Ambulatory Visit (HOSPITAL_BASED_OUTPATIENT_CLINIC_OR_DEPARTMENT_OTHER)
Admission: RE | Admit: 2020-12-30 | Discharge: 2020-12-30 | Disposition: A | Discharge status: Home / Self Care | Payer: Managed Care, Other (non HMO) | Source: Ambulatory Visit | Attending: Internal Medicine | Admitting: Internal Medicine

## 2020-12-30 DIAGNOSIS — R7989 Other specified abnormal findings of blood chemistry: Secondary | ICD-10-CM | POA: Diagnosis present

## 2021-01-03 ENCOUNTER — Encounter: Payer: Self-pay | Admitting: Internal Medicine

## 2021-01-06 ENCOUNTER — Other Ambulatory Visit: Payer: Self-pay

## 2021-01-06 ENCOUNTER — Encounter: Payer: Self-pay | Admitting: Internal Medicine

## 2021-01-06 ENCOUNTER — Ambulatory Visit: Payer: Managed Care, Other (non HMO) | Admitting: Internal Medicine

## 2021-01-06 DIAGNOSIS — F1020 Alcohol dependence, uncomplicated: Secondary | ICD-10-CM | POA: Diagnosis not present

## 2021-01-06 DIAGNOSIS — R109 Unspecified abdominal pain: Secondary | ICD-10-CM

## 2021-01-06 DIAGNOSIS — F5104 Psychophysiologic insomnia: Secondary | ICD-10-CM | POA: Diagnosis not present

## 2021-01-06 MED ORDER — TRAZODONE HCL 50 MG PO TABS: ORAL_TABLET | ORAL | 1 refills | Status: DC

## 2021-01-06 MED ORDER — LIDOCAINE 5 % EX PTCH: 1.0000 | MEDICATED_PATCH | CUTANEOUS | 0 refills | Status: DC

## 2021-01-06 NOTE — Patient Instructions (Signed)
We have sent in lidocaine patches to try for the pain.  I would recommend to cut back on alcohol to help with your health long term.

## 2021-01-06 NOTE — Progress Notes (Signed)
   Subjective:   Patient ID: Ronald Benton, male    DOB: July 07, 1975, 45 y.o.   MRN: [id number]  HPI The patient is a 45 YO man coming in for concerns ongoing about right flank pain. Recent labs and [place] and wants to discuss. Still having the pain and ongoing. Still using alcohol about the same and needs refill of trazodone.  Review of Systems  Constitutional: Negative.   HENT: Negative.    Eyes: Negative.   Respiratory:  Negative for cough, chest tightness and shortness of breath.   Cardiovascular:  Negative for chest pain, palpitations and leg swelling.  Gastrointestinal:  Positive for abdominal pain. Negative for abdominal distention, constipation, diarrhea, nausea and vomiting.  Genitourinary:  Positive for flank pain.  Skin: Negative.   Neurological: Negative.   Psychiatric/Behavioral: Negative.     Objective:  Physical Exam Constitutional:      Appearance: He is well-developed.     Comments: Mild distress  HENT:     Head: Normocephalic and atraumatic.  Cardiovascular:     Rate and Rhythm: Normal rate and regular rhythm.  Pulmonary:     Effort: Pulmonary effort is normal. No respiratory distress.     Breath sounds: Normal breath sounds. No wheezing or rales.  Abdominal:     General: Bowel sounds are normal. There is no distension.     Palpations: Abdomen is soft.     Tenderness: There is no abdominal tenderness. There is no rebound.  Musculoskeletal:        General: Tenderness present.     Cervical back: Normal range of motion.     Comments: Tenderness right flank  Skin:    General: Skin is warm and dry.  Neurological:     Mental Status: He is alert and oriented to person, place, and time.     Coordination: Coordination normal.    Vitals:   01/06/21 1441  BP: 112/90  Pulse: 78  Resp: 18  Temp: 98.2 F (36.8 C)  TempSrc: Oral  SpO2: 98%  Weight: 154 lb 12.8 oz (70.2 kg)  Height: 5\' 10"  (1.778 m)    This visit occurred during the SARS-CoV-2 public health  emergency.  Safety protocols were in place, including screening questions prior to the visit, additional usage of staff PPE, and extensive cleaning of exam room while observing appropriate contact time as indicated for disinfecting solutions.   Assessment & Plan:

## 2021-01-06 NOTE — Assessment & Plan Note (Signed)
He is still using alcohol and with elevated LFTs and liver changes on [place] advised to cut back or stop if able.

## 2021-01-06 NOTE — Assessment & Plan Note (Signed)
[place] without signs of cirrhosis which is reassuring. Advised that the liver or pancreas could be causing pain. He did have mildly elevated lipase levels. Cannot take NSAIDs. Rx lidoderm patches. Advised to work on cutting back on alcohol as alcohol does impact the health of both the pancreas and the liver.

## 2021-01-06 NOTE — Assessment & Plan Note (Signed)
Okay with refill trazodone until he sees PCP back. He is at higher risk for oversedation given concurrent alcohol usage.

## 2021-01-16 ENCOUNTER — Encounter (HOSPITAL_COMMUNITY): Payer: Self-pay | Admitting: Emergency Medicine

## 2021-01-16 ENCOUNTER — Other Ambulatory Visit: Payer: Self-pay

## 2021-01-16 ENCOUNTER — Emergency Department (HOSPITAL_COMMUNITY): Payer: Managed Care, Other (non HMO)

## 2021-01-16 ENCOUNTER — Emergency Department (HOSPITAL_COMMUNITY)
Admission: EM | Admit: 2021-01-16 | Discharge: 2021-01-17 | Disposition: A | Discharge status: Home / Self Care | Payer: Managed Care, Other (non HMO) | Attending: Emergency Medicine | Admitting: Emergency Medicine

## 2021-01-16 DIAGNOSIS — R4182 Altered mental status, unspecified: Secondary | ICD-10-CM | POA: Insufficient documentation

## 2021-01-16 DIAGNOSIS — S52514A Nondisplaced fracture of right radial styloid process, initial encounter for closed fracture: Secondary | ICD-10-CM | POA: Diagnosis not present

## 2021-01-16 DIAGNOSIS — W01198A Fall on same level from slipping, tripping and stumbling with subsequent striking against other object, initial encounter: Secondary | ICD-10-CM | POA: Diagnosis not present

## 2021-01-16 DIAGNOSIS — Y908 Blood alcohol level of 240 mg/100 ml or more: Secondary | ICD-10-CM | POA: Diagnosis not present

## 2021-01-16 DIAGNOSIS — F17210 Nicotine dependence, cigarettes, uncomplicated: Secondary | ICD-10-CM | POA: Insufficient documentation

## 2021-01-16 DIAGNOSIS — S6991XA Unspecified injury of right wrist, hand and finger(s), initial encounter: Secondary | ICD-10-CM | POA: Diagnosis present

## 2021-01-16 HISTORY — DX: Anxiety disorder, unspecified: F41.9

## 2021-01-16 LAB — COMPREHENSIVE METABOLIC PANEL
ALT: 78 U/L — ABNORMAL HIGH (ref 0–44)
AST: 136 U/L — ABNORMAL HIGH (ref 15–41)
Albumin: 4.3 g/dL (ref 3.5–5.0)
Alkaline Phosphatase: 74 U/L (ref 38–126)
Anion gap: 13 (ref 5–15)
BUN: 6 mg/dL (ref 6–20)
CO2: 28 mmol/L (ref 22–32)
Calcium: 9.2 mg/dL (ref 8.9–10.3)
Chloride: 99 mmol/L (ref 98–111)
Creatinine, Ser: 0.72 mg/dL (ref 0.61–1.24)
GFR, Estimated: >60 mL/min (ref >60)
Glucose, Bld: 93 mg/dL (ref 70–99)
Potassium: 3.9 mmol/L (ref 3.5–5.1)
Sodium: 140 mmol/L (ref 135–145)
Total Bilirubin: 0.9 mg/dL (ref 0.3–1.2)
Total Protein: 8.3 g/dL — ABNORMAL HIGH (ref 6.5–8.1)

## 2021-01-16 LAB — CBC WITH DIFFERENTIAL/PLATELET
Abs Immature Granulocytes: 0.02 10*3/uL (ref 0.00–0.07)
Basophils Absolute: 0.1 10*3/uL (ref 0.0–0.1)
Basophils Relative: 1 %
Eosinophils Absolute: 0.2 10*3/uL (ref 0.0–0.5)
Eosinophils Relative: 2 %
HCT: 44.3 % (ref 39.0–52.0)
Hemoglobin: 15.1 g/dL (ref 13.0–17.0)
Immature Granulocytes: 0 %
Lymphocytes Relative: 29 %
Lymphs Abs: 2.3 10*3/uL (ref 0.7–4.0)
MCH: 34.6 pg — ABNORMAL HIGH (ref 26.0–34.0)
MCHC: 34.1 g/dL (ref 30.0–36.0)
MCV: 101.4 fL — ABNORMAL HIGH (ref 80.0–100.0)
Monocytes Absolute: 0.8 10*3/uL (ref 0.1–1.0)
Monocytes Relative: 11 %
Neutro Abs: 4.5 10*3/uL (ref 1.7–7.7)
Neutrophils Relative %: 57 %
Platelets: 135 10*3/uL — ABNORMAL LOW (ref 150–400)
RBC: 4.37 MIL/uL (ref 4.22–5.81)
RDW: 11.7 % (ref 11.5–15.5)
WBC: 7.9 10*3/uL (ref 4.0–10.5)
nRBC: 0 % (ref 0.0–0.2)

## 2021-01-16 LAB — RAPID URINE DRUG SCREEN, HOSP PERFORMED
Amphetamines: NOT DETECTED
Barbiturates: NOT DETECTED
Benzodiazepines: POSITIVE — AB
Cocaine: NOT DETECTED
Opiates: NOT DETECTED
Tetrahydrocannabinol: NOT DETECTED

## 2021-01-16 LAB — AMMONIA: Ammonia: 30 umol/L (ref 9–35)
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
## 2021-01-16 NOTE — ED Notes (Signed)
Called ortho to apply splint for patient.

## 2021-01-16 NOTE — ED Notes (Signed)
Ortho at bedside to apply long arm splint and shoulder sling before discharge.

## 2021-01-16 NOTE — Discharge Instructions (Addendum)
You were evaluated in the Emergency Department and after careful evaluation, we did not find any emergent condition requiring admission or further testing in the hospital.  Your exam/testing today was overall reassuring.  Please follow-up with orthopedics regarding your wrist injury.  Please return to the Emergency Department if you experience any worsening of your condition.  Thank you for allowing [place] to be a part of your care.
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
## 2021-01-17 LAB — ETHANOL: Alcohol, Ethyl (B): 351 mg/dL (ref <10)

## 2021-01-17 MED ORDER — HYDROCODONE-ACETAMINOPHEN 5-325 MG PO TABS: 1.0000 | ORAL_TABLET | ORAL | 0 refills | Status: DC | PRN

## 2021-01-17 NOTE — ED Provider Notes (Signed)
  Provider Note MRN:  [id number]  Arrival date & time: 01/17/21    ED Course and Medical Decision Making  Assumed care from Dr. Madilyn Hook at shift change.  On reassessment patient is clinically sober, arm is neurovascular intact, work-up is otherwise reassuring.  Appropriate for discharge.  Patient's significant other will manage his pain medication at home.  Procedures  Final Clinical Impressions(s) / ED Diagnoses     ICD-10-CM   1. Closed nondisplaced fracture of styloid process of right radius, initial encounter  S52.514A       ED Discharge Orders          Ordered    HYDROcodone-acetaminophen (NORCO/VICODIN) 5-325 MG tablet  Every 4 hours PRN        01/17/21 0059              Discharge Instructions      You were evaluated in the Emergency Department and after careful evaluation, we did not find any emergent condition requiring admission or further testing in the hospital.  Your exam/testing today was overall reassuring.  Please follow-up with orthopedics regarding your wrist injury.  Please return to the Emergency Department if you experience any worsening of your condition.  Thank you for allowing [place] to be a part of your care.     Elmer Sow. Pilar Plate, MD [facility] Emergency Medicine [facility] [email or web address]    Sabas Sous, MD 01/17/21 [phone]

## 2021-01-17 NOTE — ED Notes (Signed)
Called lab to check on ethanol lab, they are running it now.

## 2021-01-17 NOTE — Progress Notes (Signed)
Orthopedic Tech Progress Note Patient Details:  Ronald Benton [facility] 09/12/1975 [id number]  Ortho Devices Type of Ortho Device: Arm sling, [place] splint Ortho Device/Splint Location: rue Ortho Device/Splint Interventions: Ordered, Application, Adjustment   Post Interventions Patient Tolerated: Well Instructions Provided: Care of device, Adjustment of device  Trinna Post 01/17/2021, 12:30 AM

## 2021-01-17 NOTE — ED Notes (Signed)
RN informed patient of alcohol level of 351. Patient repeated back and verbalized understanding of NOT drinking alcohol or driving a motor vehicle while taking the prescribed pain medication. Patients spouse verbalized understanding of the dangers of taking the pain medication while drinking alcohol or driving as it impairs thinking and respirations. Patient helped into vehicle and is discharged.

## 2021-01-17 NOTE — ED Notes (Signed)
Patient given ice pack

## 2021-01-17 NOTE — ED Notes (Addendum)
Patient asking for pain medication. Dr. Madilyn Hook notified and she said cannot do ibuprofen due to allergy, cannot do tylenol due to liver enzymes being elevated. Waiting for Alcohol level to result.

## 2021-01-18 ENCOUNTER — Ambulatory Visit (INDEPENDENT_AMBULATORY_CARE_PROVIDER_SITE_OTHER): Payer: Managed Care, Other (non HMO) | Admitting: Family Medicine

## 2021-01-18 ENCOUNTER — Other Ambulatory Visit: Payer: Self-pay

## 2021-01-18 VITALS — BP 126/95 | Ht 70.0 in | Wt 157.0 lb

## 2021-01-18 DIAGNOSIS — M25531 Pain in right wrist: Secondary | ICD-10-CM

## 2021-01-18 MED ORDER — OXYCODONE HCL 5 MG PO TABS: 5.0000 mg | ORAL_TABLET | Freq: Four times a day (QID) | ORAL | 0 refills | Status: DC | PRN

## 2021-01-18 NOTE — Assessment & Plan Note (Signed)
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  His [place] was suggestive of effusion withiin wrist joint, but no tenosynovitis noted in any dorsal compartments, no fracture noted on XR, no 4th or 5th MC fracture noted on XR, no TTP over anatomic snuffbox, and no abnormality of scaphoid noted on [place].  His picture is most likely c/w wrist sprain.  He can wear a wrist brace and will give him 15 tabs of oxy 5mg , given his NSAID allergy (causes hives) and elevated LFTs.  Advised no refills will be provided and PMP reviewed without red flags.  He will be out of work for 2 weeks, as he is a , but if improved before this, will come back sooner for work release.
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
## 2021-01-18 NOTE — Patient Instructions (Signed)
Thank you for coming to see me today. It was a pleasure. Today we talked about:   You have a wrist sprain.  We will write you out of work for 2 weeks, but if you are feeling better before that, call to make an appointment to see [place] sooner.    We will give you 15 tablets of oxycodone for your pain, but there will be no refills.  Use the wrist brace and ice it as well.  Please follow-up with [place] in 2 weeks.  If you have any questions or concerns, please do not hesitate to call the office at [phone].  Best,   Luis Abed, DO [facility]

## 2021-01-19 ENCOUNTER — Encounter: Payer: Self-pay | Admitting: Family Medicine

## 2021-02-01 ENCOUNTER — Ambulatory Visit: Payer: Managed Care, Other (non HMO) | Admitting: Family Medicine

## 2021-05-28 ENCOUNTER — Other Ambulatory Visit: Payer: Self-pay | Admitting: Internal Medicine

## 2021-09-06 ENCOUNTER — Ambulatory Visit: Payer: Managed Care, Other (non HMO) | Admitting: Internal Medicine

## 2021-09-13 ENCOUNTER — Encounter: Payer: Managed Care, Other (non HMO) | Admitting: Internal Medicine

## 2022-03-07 ENCOUNTER — Encounter: Payer: Managed Care, Other (non HMO) | Admitting: Internal Medicine

## 2022-05-04 ENCOUNTER — Emergency Department (HOSPITAL_COMMUNITY): Payer: Managed Care, Other (non HMO)

## 2022-05-04 ENCOUNTER — Encounter (HOSPITAL_COMMUNITY): Payer: Self-pay

## 2022-05-04 ENCOUNTER — Emergency Department (HOSPITAL_COMMUNITY)
Admission: EM | Admit: 2022-05-04 | Discharge: 2022-05-04 | Disposition: A | Discharge status: Home / Self Care | Payer: Managed Care, Other (non HMO) | Attending: Emergency Medicine | Admitting: Emergency Medicine

## 2022-05-04 DIAGNOSIS — S42211A Unspecified displaced fracture of surgical neck of right humerus, initial encounter for closed fracture: Secondary | ICD-10-CM | POA: Insufficient documentation

## 2022-05-04 DIAGNOSIS — F17210 Nicotine dependence, cigarettes, uncomplicated: Secondary | ICD-10-CM | POA: Insufficient documentation

## 2022-05-04 DIAGNOSIS — S4991XA Unspecified injury of right shoulder and upper arm, initial encounter: Secondary | ICD-10-CM | POA: Diagnosis present

## 2022-05-04 MED ORDER — ONDANSETRON HCL 4 MG/2ML IJ SOLN
4.0000 mg | Freq: Once | INTRAMUSCULAR | Status: AC
  Administered 2022-05-04: 4 mg via INTRAVENOUS
  Filled 2022-05-04: qty 2

## 2022-05-04 MED ORDER — MORPHINE SULFATE (PF) 4 MG/ML IV SOLN
4.0000 mg | Freq: Once | INTRAVENOUS | Status: AC
  Administered 2022-05-04: 4 mg via INTRAVENOUS
  Filled 2022-05-04: qty 1

## 2022-05-04 MED ORDER — OXYCODONE HCL 5 MG PO TABS: 5.0000 mg | ORAL_TABLET | Freq: Four times a day (QID) | ORAL | 0 refills | Status: DC | PRN

## 2022-05-04 MED ORDER — ACETAMINOPHEN 500 MG PO TABS
1000.0000 mg | ORAL_TABLET | Freq: Once | ORAL | Status: AC
  Administered 2022-05-04: 1000 mg via ORAL
  Filled 2022-05-04: qty 2

## 2022-05-04 NOTE — Progress Notes (Signed)
Orthopedic Tech Progress Note Patient Details:  Ronald Benton [facility] [birth year] [id number]  Ortho Devices Type of Ortho Device: Shoulder immobilizer Ortho Device/Splint Location: Right arm Ortho Device/Splint Interventions: Application   Post Interventions Patient Tolerated: Well  Ronald Benton Ronald Benton 05/04/2022, 3:42 PM
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
## 2022-05-04 NOTE — Discharge Instructions (Addendum)
We evaluated you for your shoulder pain.  Your x-ray shows a fracture of your right shoulder.  Please keep your sling on until you see the orthopedic surgeon.  Please call Dr. Lyla Glassing to set up an appointment.  Take 1000 mg of Tylenol (two 500 mg extra strength tylenol tablets) every 6 hours for pain.  I have also prescribed you oxycodone 5 mg.  Please take 1/2 to 1 tablet every 6 hours as needed for pain if your symptoms are not controlled with Tylenol.  Please also use ice to help reduce swelling.  return to the emergency department if you develop numbness, tingling, severe pain, worsening swelling, weakness, or any other concerning symptoms.
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
## 2022-07-16 ENCOUNTER — Encounter: Payer: Self-pay | Admitting: Internal Medicine

## 2022-07-16 NOTE — Telephone Encounter (Signed)
Patient called to follow up on this because it hurts so bad

## 2022-10-16 IMAGING — CT CT HEAD W/O CM
3 series · 16 of 47 positions shown, 19 images · non-contrast
Comparison: None.

CLINICAL DATA: Altered mental status

EXAM:
CT HEAD WITHOUT CONTRAST
TECHNIQUE: Contiguous axial images were obtained from the base of the skull
through the vertex without intravenous contrast.

[Series 2: head wo · axial · 0.44mm/px · z∈[-128,-3]mm · 10 of 31 slices shown, 13 images]
[im 3/31  brain]
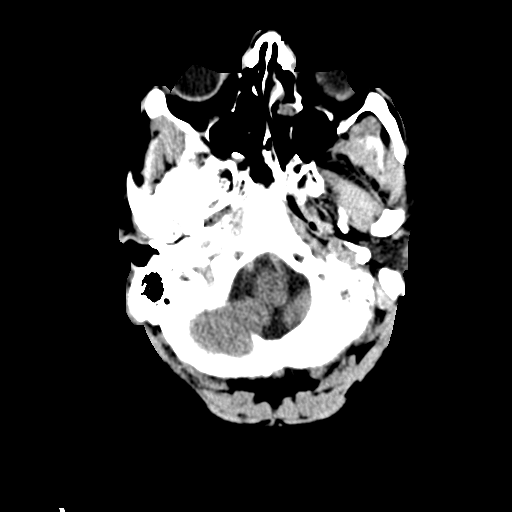
[im 3/31  bone]
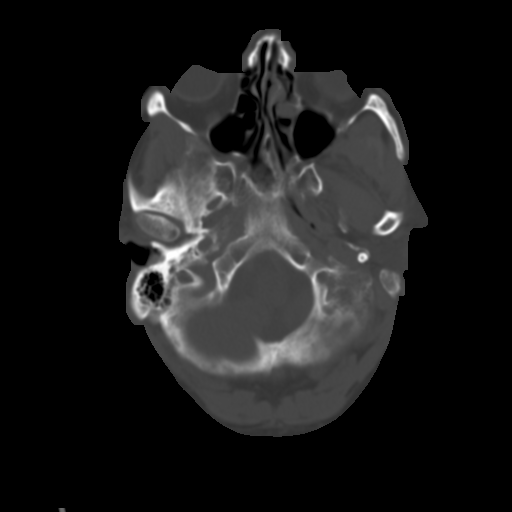
[im 6/31  brain]
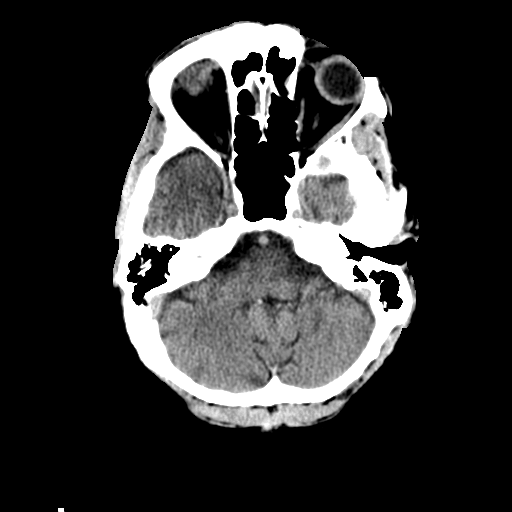
[im 9/31  brain]
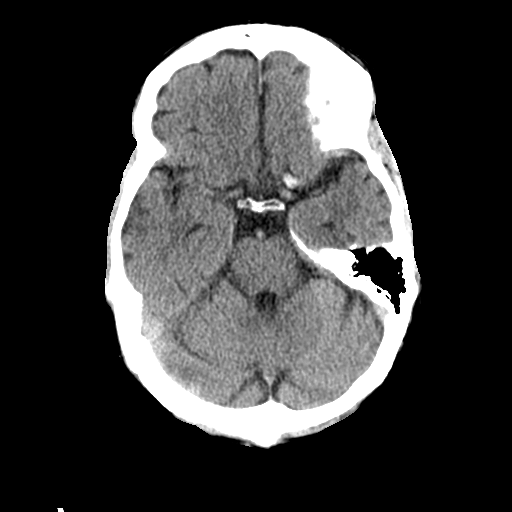
[im 11/31  brain]
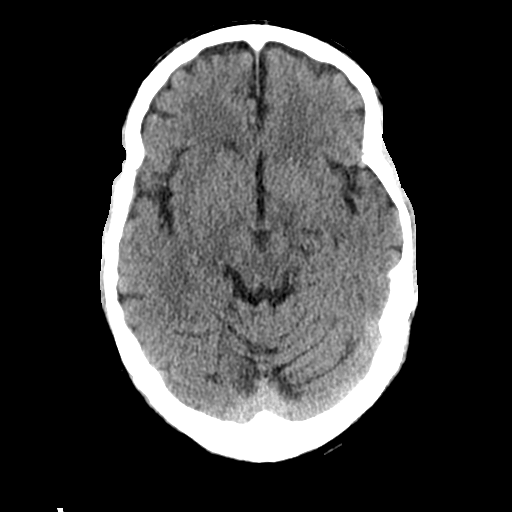
[im 14/31  brain]
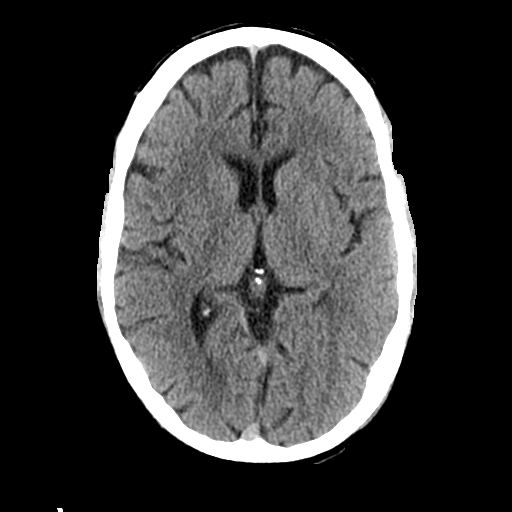
[im 14/31  bone]
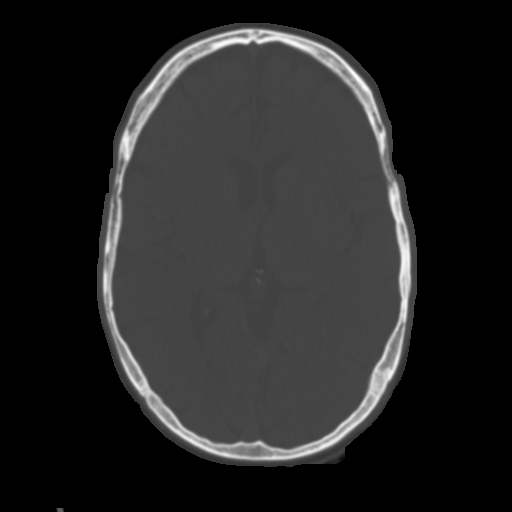
[im 17/31  brain]
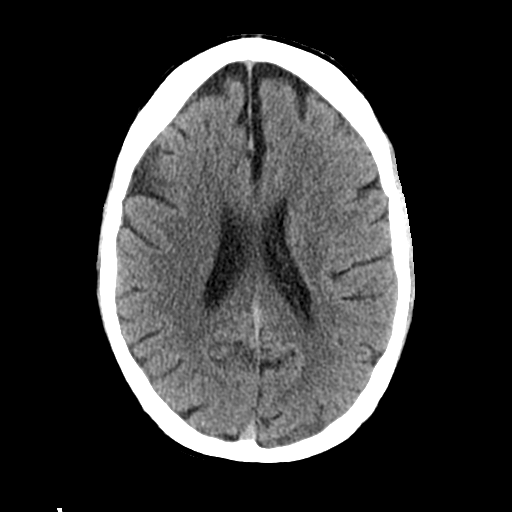
[im 20/31  brain]
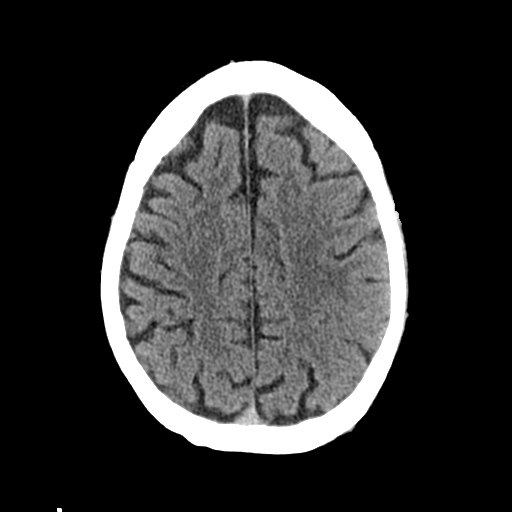
[im 23/31  brain]
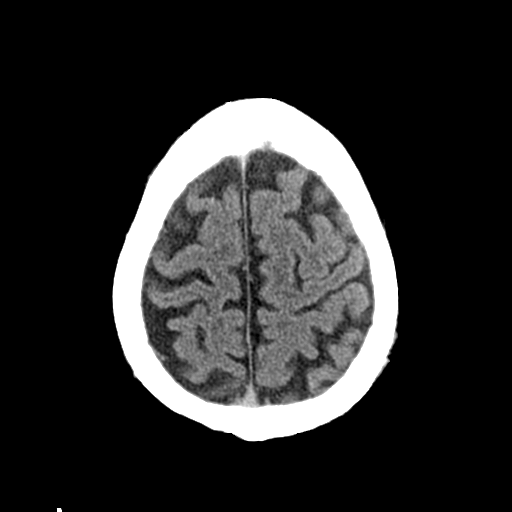
[im 25/31  brain]
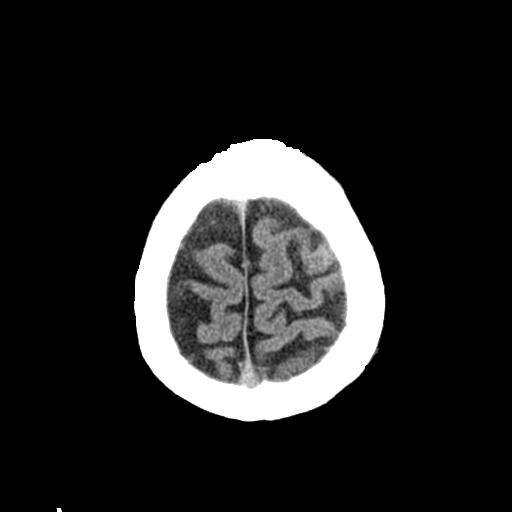
[im 25/31  bone]
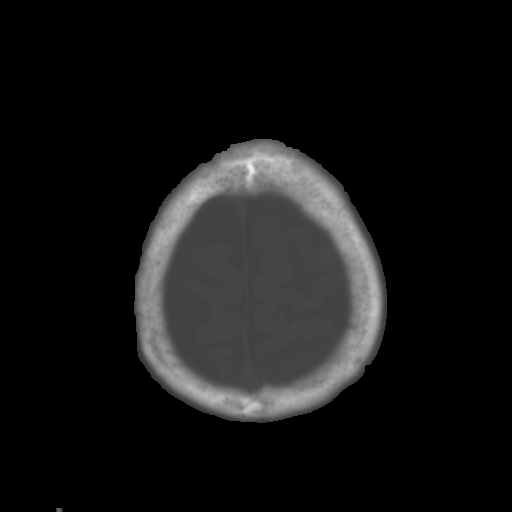
[im 28/31  brain]
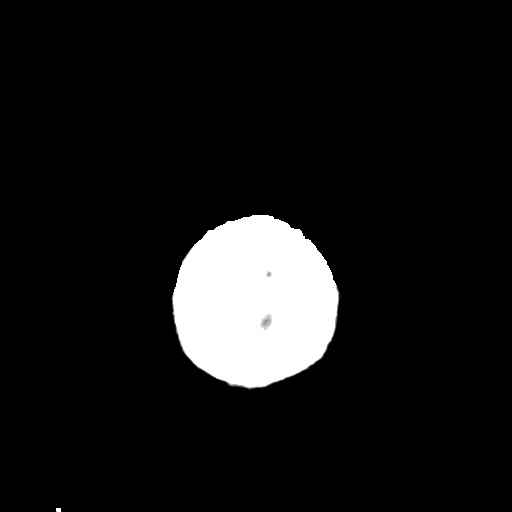

[Series 5: coronal soft tissue · coronal · 0.29mm/px · 3 of 69 slices shown]
[im 23/69  brain]
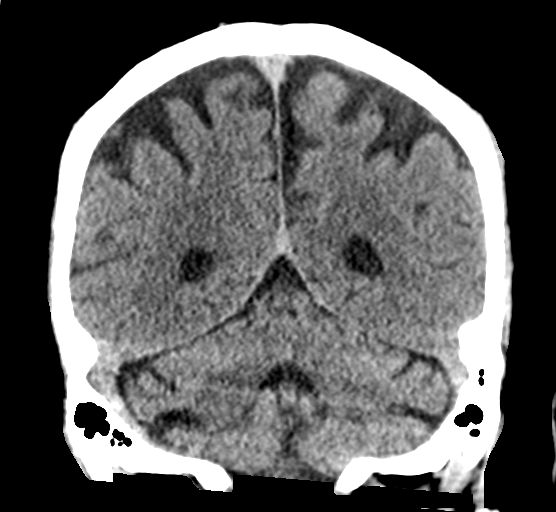
[im 31/69  brain]
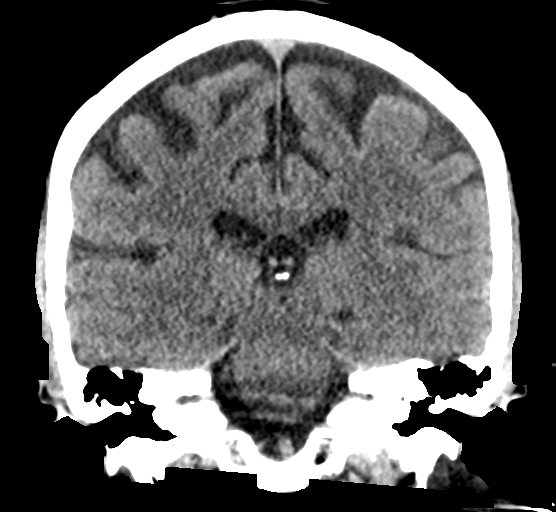
[im 38/69  brain]
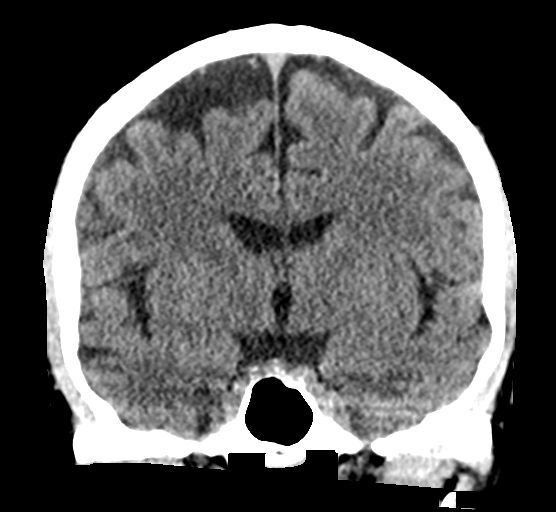

[Series 6: sagittal soft tissue · sagittal · 0.30mm/px · 3 of 52 slices shown]
[im 18/52  brain]
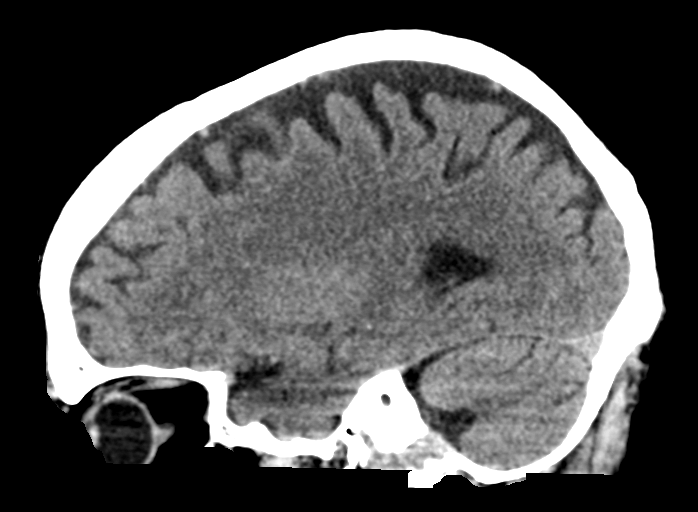
[im 26/52  brain]
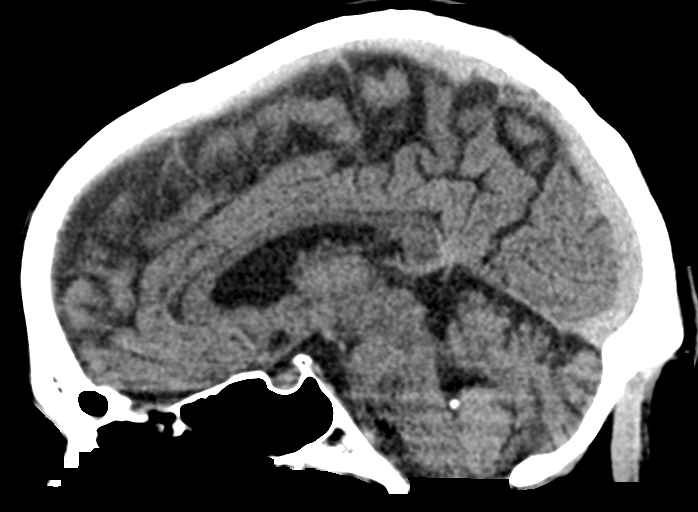
[im 35/52  brain]
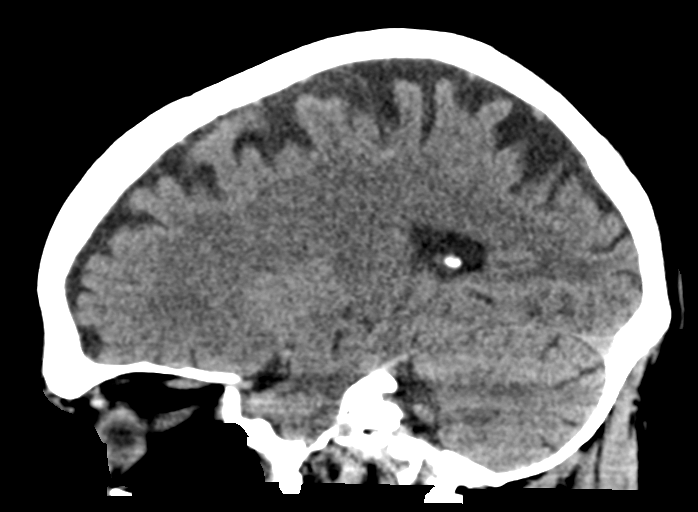

[16 of 47 positions shown; findings below may reference images not displayed]

FINDINGS: Brain: Normal anatomic configuration. No abnormal intra or
extra-axial mass lesion or fluid collection. No abnormal mass effect
or midline shift. No evidence of acute intracranial hemorrhage or
infarct. Ventricular size is normal. Cerebellum unremarkable.

Vascular: Unremarkable

Skull: Intact

Sinuses/Orbits: Paranasal sinuses are clear. Orbits are
unremarkable.

Other: Mastoid air cells and middle ear cavities are clear.
IMPRESSION: No acute intracranial abnormality.  Normal examination.

## 2022-11-19 ENCOUNTER — Encounter: Payer: Self-pay | Admitting: Internal Medicine

## 2022-11-19 ENCOUNTER — Telehealth: Payer: Self-pay | Admitting: Internal Medicine

## 2022-11-19 ENCOUNTER — Ambulatory Visit: Payer: Managed Care, Other (non HMO) | Admitting: Internal Medicine

## 2022-11-19 VITALS — BP 122/76 | HR 100 | Temp 98.6°F | Ht 70.0 in | Wt 155.0 lb

## 2022-11-19 DIAGNOSIS — N50811 Right testicular pain: Secondary | ICD-10-CM | POA: Diagnosis not present

## 2022-11-19 DIAGNOSIS — F419 Anxiety disorder, unspecified: Secondary | ICD-10-CM | POA: Diagnosis not present

## 2022-11-19 MED ORDER — AMITRIPTYLINE HCL 25 MG PO TABS: ORAL_TABLET | ORAL | 5 refills | Status: DC
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
## 2022-11-19 NOTE — Assessment & Plan Note (Signed)
Chronic Management per Dr Evelene Croon Stress level is high  On xanax

## 2022-11-19 NOTE — Patient Instructions (Addendum)
      Medications changes include :   amitriptyline 12.5 mg at bedtime x 4 days and then 25 mg at bedtime nightly       Return if symptoms worsen or fail to improve.

## 2022-11-19 NOTE — Telephone Encounter (Signed)
Patient has not been seen in 3 years by provider and would like to be seen. Is patient able to be scheduled for a new patient appointment? Best callback is [phone].

## 2022-11-19 NOTE — Assessment & Plan Note (Signed)
Acute Has had same symptoms in past - discomfort - not pain Saw urology in the past for this - exam normal - thought to be related to pelvic muscle dysfunction - improved with amitriptyline so we will try this again Deferred exam Deferred urology referral  Amitriptyline 12.5 mg HS x 4 nights and then 25 mg HS

## 2023-05-13 ENCOUNTER — Other Ambulatory Visit: Payer: Self-pay | Admitting: Internal Medicine

## 2024-01-01 ENCOUNTER — Ambulatory Visit: Payer: Self-pay | Admitting: *Deleted

## 2024-01-01 NOTE — Telephone Encounter (Signed)
 If he is not able to get into his mychart he can contact my-chart support number at [phone] and they can help him reset his password.

## 2024-01-01 NOTE — Telephone Encounter (Signed)
 Message left for patient.  We have not seen him since May of last year. He will either need to set up a VV in order to receive a refill on his pain medication if he is not able to get to an appointment and be seen.  Providers are not able to prescribe a controlled substance for a patient they have not seen before.  This info was left for him to either contact [facility] back and see if they will extend his refill or he can set up a virtual appointment if he is not able to drive or has no transportation.  He will not be able to have refill without the options given.   If he calls back please relay info to him.

## 2024-01-01 NOTE — Telephone Encounter (Signed)
 FYI Only or Action Required?: Action required by provider: medication refill request. For medication hydrocodone  prescribed at [facility]   Patient was last seen in primary care on 11/19/2022 by Geofm Glade PARAS, MD. Called Nurse Triage reporting Ankle Pain. Symptoms began several weeks ago. Interventions attempted: hydrocodone  . Symptoms are: unchanged.  Triage Disposition: See HCP Within 4 Hours (Or PCP Triage)  Patient/caregiver understands and will follow disposition?: No, wishes to speak with PCP         Copied from CRM [id number]. Topic: Clinical - Red Word Triage >> Jan 01, 2024  9:31 AM Ernestene P wrote: Red Word that prompted transfer to Nurse Triage: extreme pain in ankle- does have a cast Reason for Disposition  [1] SEVERE pain (e.g., excruciating, unable to walk) AND [2] not improved after 2 hours of pain medicine  Answer Assessment - Initial Assessment Questions 1. ONSET: When did the pain start?      Greater than 10 days  2. LOCATION: Where is the pain located?      Right ankle 3. PAIN: How bad is the pain?    (Scale 1-10; or mild, moderate, severe)  - MILD (1-3): doesn't interfere with normal activities.   - MODERATE (4-7): interferes with normal activities (e.g., work or school) or awakens from sleep, limping.   - SEVERE (8-10): excruciating pain, unable to do any normal activities, unable to walk.      Severe pain can walk 4. WORK OR EXERCISE: Has there been any recent work or exercise that involved this part of the body?      Na  5. CAUSE: What do you think is causing the ankle pain?     Closed fracture 6. OTHER SYMPTOMS: Do you have any other symptoms? (e.g., calf pain, rash, fever, swelling)     Denies only severe pain no cast on ankle 7. PREGNANCY: Is there any chance you are pregnant? When was your last menstrual period?     Na  Patient requesting pain medication hydrocodone  that was recently prescribed at [facility]. Offered appt today with other provider none  available with PCP. Patient reports he has issues with transportation and does not know how to do VV. Please advise.  Protocols used: Ankle Pain-A-AH

## 2024-04-07 NOTE — Progress Notes (Deleted)
    Subjective:    Patient ID: Ronald Benton [facility], male    DOB: 1976/06/30, 48 y.o.   MRN: [id number]      HPI Semaj is here for No chief complaint on file.        Medications and allergies reviewed with patient and updated if appropriate.  Current Outpatient Medications on File Prior to Visit  Medication Sig Dispense Refill   ALPRAZolam (XANAX) 1 MG tablet Take 1 mg by mouth 3 (three) times daily.     amitriptyline  (ELAVIL ) 25 MG tablet TAKE 1/2 TABLET BY MOUTH AT BEDTIME FOR 4 NIGHTS. INCREASE TO 1 TABLET AT BEDTIME 30 tablet 5   No current facility-administered medications on file prior to visit.    Review of Systems     Objective:  There were no vitals filed for this visit. BP Readings from Last 3 Encounters:  11/19/22 122/76  05/04/22 128/89  01/18/21 (!) 126/95   Wt Readings from Last 3 Encounters:  11/19/22 155 lb (70.3 kg)  01/18/21 157 lb (71.2 kg)  01/16/21 157 lb (71.2 kg)   There is no height or weight on file to calculate BMI.    Physical Exam         Assessment & Plan:    See Problem List for Assessment and Plan of chronic medical problems.

## 2024-04-08 ENCOUNTER — Ambulatory Visit: Admitting: Internal Medicine
